# Patient Record
Sex: Male | Born: 1950 | Race: White | Hispanic: No | Marital: Married | Smoking: Former smoker
Health system: Southern US, Community
[De-identification: ages and names within clinical notes are randomized; demographics above are authoritative.]

## PROBLEM LIST (undated history)

## (undated) DIAGNOSIS — K219 Gastro-esophageal reflux disease without esophagitis: Secondary | ICD-10-CM

## (undated) DIAGNOSIS — B1920 Unspecified viral hepatitis C without hepatic coma: Secondary | ICD-10-CM

## (undated) DIAGNOSIS — Z8669 Personal history of other diseases of the nervous system and sense organs: Secondary | ICD-10-CM

## (undated) DIAGNOSIS — Z87442 Personal history of urinary calculi: Secondary | ICD-10-CM

## (undated) DIAGNOSIS — K5900 Constipation, unspecified: Secondary | ICD-10-CM

## (undated) DIAGNOSIS — M1990 Unspecified osteoarthritis, unspecified site: Secondary | ICD-10-CM

## (undated) DIAGNOSIS — F4310 Post-traumatic stress disorder, unspecified: Secondary | ICD-10-CM

## (undated) DIAGNOSIS — Z8614 Personal history of Methicillin resistant Staphylococcus aureus infection: Secondary | ICD-10-CM

## (undated) DIAGNOSIS — J45909 Unspecified asthma, uncomplicated: Secondary | ICD-10-CM

## (undated) DIAGNOSIS — I10 Essential (primary) hypertension: Secondary | ICD-10-CM

## (undated) DIAGNOSIS — G629 Polyneuropathy, unspecified: Secondary | ICD-10-CM

## (undated) DIAGNOSIS — K649 Unspecified hemorrhoids: Secondary | ICD-10-CM

## (undated) HISTORY — PX: EYE SURGERY: SHX253

## (undated) HISTORY — PX: UPPER GASTROINTESTINAL ENDOSCOPY: SHX188

## (undated) HISTORY — PX: TOE AMPUTATION: SHX809

## (undated) HISTORY — PX: TRANSURETHRAL RESECTION OF PROSTATE: SHX73

## (undated) HISTORY — PX: NASAL SINUS SURGERY: SHX719

## (undated) HISTORY — PX: BACK SURGERY: SHX140

## (undated) HISTORY — PX: CATARACT EXTRACTION: SUR2

---

## 1969-06-17 HISTORY — PX: APPENDECTOMY: SHX54

## 2012-03-29 ENCOUNTER — Other Ambulatory Visit: Payer: Self-pay | Admitting: Neurological Surgery

## 2012-05-25 ENCOUNTER — Ambulatory Visit: Admit: 2012-05-25 | Payer: Self-pay | Admitting: Neurological Surgery

## 2012-05-25 SURGERY — LUMBAR LAMINECTOMY/DECOMPRESSION MICRODISCECTOMY
Anesthesia: General | Site: Back | Laterality: Left

## 2012-06-04 ENCOUNTER — Other Ambulatory Visit: Payer: Self-pay | Admitting: Neurological Surgery

## 2012-06-18 ENCOUNTER — Encounter (HOSPITAL_COMMUNITY): Payer: Self-pay

## 2012-06-20 ENCOUNTER — Encounter (HOSPITAL_COMMUNITY)
Admission: RE | Admit: 2012-06-20 | Discharge: 2012-06-20 | Disposition: A | Discharge status: Home / Self Care | Payer: Medicare Other | Source: Ambulatory Visit | Attending: Neurological Surgery | Admitting: Neurological Surgery

## 2012-06-20 ENCOUNTER — Encounter (HOSPITAL_COMMUNITY)
Admission: RE | Admit: 2012-06-20 | Discharge: 2012-06-20 | Disposition: A | Discharge status: Home / Self Care | Payer: Medicare Other | Source: Ambulatory Visit | Attending: Anesthesiology | Admitting: Anesthesiology

## 2012-06-20 ENCOUNTER — Encounter (HOSPITAL_COMMUNITY): Payer: Self-pay

## 2012-06-20 HISTORY — DX: Unspecified viral hepatitis C without hepatic coma: B19.20

## 2012-06-20 HISTORY — DX: Personal history of other diseases of the nervous system and sense organs: Z86.69

## 2012-06-20 HISTORY — DX: Unspecified asthma, uncomplicated: J45.909

## 2012-06-20 HISTORY — DX: Unspecified hemorrhoids: K64.9

## 2012-06-20 HISTORY — DX: Gastro-esophageal reflux disease without esophagitis: K21.9

## 2012-06-20 HISTORY — DX: Polyneuropathy, unspecified: G62.9

## 2012-06-20 HISTORY — DX: Personal history of Methicillin resistant Staphylococcus aureus infection: Z86.14

## 2012-06-20 LAB — CBC
HCT: 38.3 % — ABNORMAL LOW (ref 39.0–52.0)
Hemoglobin: 13.4 g/dL (ref 13.0–17.0)
MCV: 91.6 fL (ref 78.0–100.0)
RBC: 4.18 MIL/uL — ABNORMAL LOW (ref 4.22–5.81)
RDW: 12.5 % (ref 11.5–15.5)
WBC: 7.9 10*3/uL (ref 4.0–10.5)

## 2012-06-20 LAB — COMPREHENSIVE METABOLIC PANEL
Albumin: 3.8 g/dL (ref 3.5–5.2)
BUN: 14 mg/dL (ref 6–23)
Calcium: 9.6 mg/dL (ref 8.4–10.5)
GFR calc Af Amer: >90 mL/min (ref >90)
Glucose, Bld: 84 mg/dL (ref 70–99)
Potassium: 4 mEq/L (ref 3.5–5.1)
Total Protein: 7.1 g/dL (ref 6.0–8.3)

## 2012-06-20 LAB — SURGICAL PCR SCREEN: MRSA, PCR: POSITIVE — AB

## 2012-06-20 NOTE — Pre-Procedure Instructions (Signed)
20 Tanner Santana  06/20/2012   Your procedure is scheduled on:  December 10  Report to [facility] at 05:30 AM.  Call this number if you have problems the morning of surgery: [phone]   Remember:   Do not eat or drink:After Midnight.  Take these medicines the morning of surgery with A SIP OF WATER: Inhaler, Morphine, Omeprazole, Oxycodone. Prednisone   Do not wear jewelry, make-up or nail polish.  Do not wear lotions, powders, or perfumes. You may wear deodorant.  Do not shave 48 hours prior to surgery. Men may shave face and neck.  Do not bring valuables to the hospital.  Contacts, dentures or bridgework may not be worn into surgery.  Leave suitcase in the car. After surgery it may be brought to your room.  For patients admitted to the hospital, checkout time is 11:00 AM the day of discharge.   Patients discharged the day of surgery will not be allowed to drive home.  Name and phone number of your driver: Maddix Heinz  Special Instructions: Shower using CHG 2 nights before surgery and the night before surgery.  If you shower the day of surgery use CHG.  Use special wash - you have one bottle of CHG for all showers.  You should use approximately 1/3 of the bottle for each shower.   Please read over the following fact sheets that you were given: Pain Booklet, Coughing and Deep Breathing and Surgical Site Infection Prevention

## 2012-06-25 MED ORDER — CEFAZOLIN SODIUM-DEXTROSE 2-3 GM-% IV SOLR
2.0000 g | INTRAVENOUS | Status: AC
  Administered 2012-06-26: 2 g via INTRAVENOUS
  Filled 2012-06-25: qty 50

## 2012-06-26 ENCOUNTER — Ambulatory Visit (HOSPITAL_COMMUNITY)
Admission: RE | Admit: 2012-06-26 | Discharge: 2012-06-26 | Disposition: A | Discharge status: Home / Self Care | Payer: Medicare Other | Source: Ambulatory Visit | Attending: Neurological Surgery | Admitting: Neurological Surgery

## 2012-06-26 ENCOUNTER — Ambulatory Visit (HOSPITAL_COMMUNITY): Payer: Medicare Other

## 2012-06-26 ENCOUNTER — Encounter (HOSPITAL_COMMUNITY)
Admission: RE | Disposition: A | Discharge status: Home / Self Care | Payer: Self-pay | Source: Ambulatory Visit | Attending: Neurological Surgery

## 2012-06-26 ENCOUNTER — Encounter (HOSPITAL_COMMUNITY): Payer: Self-pay | Admitting: Anesthesiology

## 2012-06-26 ENCOUNTER — Ambulatory Visit (HOSPITAL_COMMUNITY): Payer: Medicare Other | Admitting: Anesthesiology

## 2012-06-26 DIAGNOSIS — Z01810 Encounter for preprocedural cardiovascular examination: Secondary | ICD-10-CM | POA: Insufficient documentation

## 2012-06-26 DIAGNOSIS — Z77098 Contact with and (suspected) exposure to other hazardous, chiefly nonmedicinal, chemicals: Secondary | ICD-10-CM | POA: Insufficient documentation

## 2012-06-26 DIAGNOSIS — Z01812 Encounter for preprocedural laboratory examination: Secondary | ICD-10-CM | POA: Insufficient documentation

## 2012-06-26 DIAGNOSIS — K219 Gastro-esophageal reflux disease without esophagitis: Secondary | ICD-10-CM | POA: Insufficient documentation

## 2012-06-26 DIAGNOSIS — M47817 Spondylosis without myelopathy or radiculopathy, lumbosacral region: Secondary | ICD-10-CM | POA: Insufficient documentation

## 2012-06-26 DIAGNOSIS — J45909 Unspecified asthma, uncomplicated: Secondary | ICD-10-CM | POA: Insufficient documentation

## 2012-06-26 DIAGNOSIS — M47816 Spondylosis without myelopathy or radiculopathy, lumbar region: Secondary | ICD-10-CM

## 2012-06-26 DIAGNOSIS — Z01818 Encounter for other preprocedural examination: Secondary | ICD-10-CM | POA: Insufficient documentation

## 2012-06-26 HISTORY — PX: LUMBAR LAMINECTOMY/DECOMPRESSION MICRODISCECTOMY: SHX5026

## 2012-06-26 SURGERY — LUMBAR LAMINECTOMY/DECOMPRESSION MICRODISCECTOMY 1 LEVEL
Anesthesia: General | Site: Spine Lumbar | Laterality: Left | Wound class: Clean

## 2012-06-26 MED ORDER — MENTHOL 3 MG MT LOZG: 1.0000 | LOZENGE | OROMUCOSAL | Status: DC | PRN

## 2012-06-26 MED ORDER — PREDNISONE 5 MG PO TABS
5.0000 mg | ORAL_TABLET | Freq: Every day | ORAL | Status: DC
  Administered 2012-06-26: 5 mg via ORAL
  Filled 2012-06-26: qty 1

## 2012-06-26 MED ORDER — SODIUM CHLORIDE 0.9 % IV SOLN
INTRAVENOUS | Status: AC
  Filled 2012-06-26: qty 500

## 2012-06-26 MED ORDER — SODIUM CHLORIDE 0.9 % IJ SOLN
3.0000 mL | Freq: Two times a day (BID) | INTRAMUSCULAR | Status: DC
  Administered 2012-06-26: 3 mL via INTRAVENOUS

## 2012-06-26 MED ORDER — MIDAZOLAM HCL 5 MG/5ML IJ SOLN
INTRAMUSCULAR | Status: DC | PRN
  Administered 2012-06-26: 2 mg via INTRAVENOUS

## 2012-06-26 MED ORDER — DIAZEPAM 5 MG PO TABS: 5.0000 mg | ORAL_TABLET | Freq: Four times a day (QID) | ORAL | Status: DC | PRN

## 2012-06-26 MED ORDER — PHENOL 1.4 % MT LIQD: 1.0000 | OROMUCOSAL | Status: DC | PRN

## 2012-06-26 MED ORDER — ROCURONIUM BROMIDE 100 MG/10ML IV SOLN
INTRAVENOUS | Status: DC | PRN
  Administered 2012-06-26: 50 mg via INTRAVENOUS

## 2012-06-26 MED ORDER — PROMETHAZINE HCL 25 MG/ML IJ SOLN: 6.2500 mg | INTRAMUSCULAR | Status: DC | PRN

## 2012-06-26 MED ORDER — ONDANSETRON HCL 4 MG/2ML IJ SOLN
INTRAMUSCULAR | Status: DC | PRN
  Administered 2012-06-26: 4 mg via INTRAVENOUS

## 2012-06-26 MED ORDER — LIDOCAINE HCL (CARDIAC) 20 MG/ML IV SOLN
INTRAVENOUS | Status: DC | PRN
  Administered 2012-06-26: 100 mg via INTRAVENOUS

## 2012-06-26 MED ORDER — BACITRACIN 50000 UNITS IM SOLR
INTRAMUSCULAR | Status: AC
  Filled 2012-06-26: qty 1

## 2012-06-26 MED ORDER — PANTOPRAZOLE SODIUM 40 MG PO TBEC: 40.0000 mg | DELAYED_RELEASE_TABLET | Freq: Every day | ORAL | Status: DC

## 2012-06-26 MED ORDER — MORPHINE SULFATE ER 60 MG PO TBCR
60.0000 mg | EXTENDED_RELEASE_TABLET | Freq: Three times a day (TID) | ORAL | Status: DC
  Administered 2012-06-26: 60 mg via ORAL
  Filled 2012-06-26: qty 1

## 2012-06-26 MED ORDER — OXYCODONE HCL 5 MG PO TABS
5.0000 mg | ORAL_TABLET | Freq: Once | ORAL | Status: AC | PRN
  Administered 2012-06-26: 5 mg via ORAL

## 2012-06-26 MED ORDER — THROMBIN 5000 UNITS EX KIT
PACK | CUTANEOUS | Status: DC | PRN
  Administered 2012-06-26 (×2): 5000 [IU] via TOPICAL

## 2012-06-26 MED ORDER — OXYCODONE HCL 5 MG PO TABS
ORAL_TABLET | ORAL | Status: AC
  Filled 2012-06-26: qty 1

## 2012-06-26 MED ORDER — HEMOSTATIC AGENTS (NO CHARGE) OPTIME
TOPICAL | Status: DC | PRN
  Administered 2012-06-26: 1 via TOPICAL

## 2012-06-26 MED ORDER — OXYCODONE HCL 5 MG PO TABS: 5.0000 mg | ORAL_TABLET | Freq: Two times a day (BID) | ORAL | Status: DC | PRN

## 2012-06-26 MED ORDER — 0.9 % SODIUM CHLORIDE (POUR BTL) OPTIME
TOPICAL | Status: DC | PRN
  Administered 2012-06-26: 1000 mL

## 2012-06-26 MED ORDER — HYDROMORPHONE HCL PF 1 MG/ML IJ SOLN
INTRAMUSCULAR | Status: AC
  Filled 2012-06-26: qty 1

## 2012-06-26 MED ORDER — CIPROFLOXACIN HCL 500 MG PO TABS
500.0000 mg | ORAL_TABLET | Freq: Two times a day (BID) | ORAL | Status: DC
  Administered 2012-06-26: 500 mg via ORAL
  Filled 2012-06-26 (×2): qty 1

## 2012-06-26 MED ORDER — OXYCODONE-ACETAMINOPHEN 5-325 MG PO TABS: 1.0000 | ORAL_TABLET | Freq: Two times a day (BID) | ORAL | Status: DC | PRN

## 2012-06-26 MED ORDER — CEFAZOLIN SODIUM 1-5 GM-% IV SOLN
1.0000 g | Freq: Three times a day (TID) | INTRAVENOUS | Status: DC
  Administered 2012-06-26: 1 g via INTRAVENOUS
  Filled 2012-06-26 (×2): qty 50

## 2012-06-26 MED ORDER — VECURONIUM BROMIDE 10 MG IV SOLR
INTRAVENOUS | Status: DC | PRN
  Administered 2012-06-26: 2 mg via INTRAVENOUS

## 2012-06-26 MED ORDER — MORPHINE SULFATE 2 MG/ML IJ SOLN
1.0000 mg | INTRAMUSCULAR | Status: DC | PRN
  Administered 2012-06-26: 2 mg via INTRAVENOUS
  Filled 2012-06-26: qty 1

## 2012-06-26 MED ORDER — OXYCODONE-ACETAMINOPHEN 10-325 MG PO TABS: 1.0000 | ORAL_TABLET | Freq: Two times a day (BID) | ORAL | Status: DC | PRN

## 2012-06-26 MED ORDER — ACETAMINOPHEN 650 MG RE SUPP: 650.0000 mg | RECTAL | Status: DC | PRN

## 2012-06-26 MED ORDER — LACTATED RINGERS IV SOLN
INTRAVENOUS | Status: DC | PRN
  Administered 2012-06-26: 07:00:00 via INTRAVENOUS

## 2012-06-26 MED ORDER — SODIUM CHLORIDE 0.9 % IR SOLN
Status: DC | PRN
  Administered 2012-06-26: 09:00:00

## 2012-06-26 MED ORDER — LIDOCAINE-EPINEPHRINE 1 %-1:100000 IJ SOLN
INTRAMUSCULAR | Status: DC | PRN
  Administered 2012-06-26: 4.5 mL

## 2012-06-26 MED ORDER — NEOSTIGMINE METHYLSULFATE 1 MG/ML IJ SOLN
INTRAMUSCULAR | Status: DC | PRN
  Administered 2012-06-26: 3 mg via INTRAVENOUS
  Administered 2012-06-26 (×2): 1 mg via INTRAVENOUS

## 2012-06-26 MED ORDER — OXYCODONE HCL 5 MG/5ML PO SOLN: 5.0000 mg | Freq: Once | ORAL | Status: AC | PRN

## 2012-06-26 MED ORDER — ACETAMINOPHEN 325 MG PO TABS: 650.0000 mg | ORAL_TABLET | ORAL | Status: DC | PRN

## 2012-06-26 MED ORDER — DEXAMETHASONE SODIUM PHOSPHATE 4 MG/ML IJ SOLN
INTRAMUSCULAR | Status: DC | PRN
  Administered 2012-06-26: 8 mg via INTRAVENOUS

## 2012-06-26 MED ORDER — GLYCOPYRROLATE 0.2 MG/ML IJ SOLN
INTRAMUSCULAR | Status: DC | PRN
  Administered 2012-06-26: 0.2 mg via INTRAVENOUS
  Administered 2012-06-26: 0.4 mg via INTRAVENOUS
  Administered 2012-06-26: 0.2 mg via INTRAVENOUS

## 2012-06-26 MED ORDER — NORTRIPTYLINE HCL 25 MG PO CAPS
75.0000 mg | ORAL_CAPSULE | Freq: Every day | ORAL | Status: DC
  Filled 2012-06-26: qty 3

## 2012-06-26 MED ORDER — HYDROMORPHONE HCL PF 1 MG/ML IJ SOLN
0.2500 mg | INTRAMUSCULAR | Status: DC | PRN
  Administered 2012-06-26 (×2): 0.5 mg via INTRAVENOUS

## 2012-06-26 MED ORDER — FENTANYL CITRATE 0.05 MG/ML IJ SOLN
INTRAMUSCULAR | Status: DC | PRN
  Administered 2012-06-26: 50 ug via INTRAVENOUS
  Administered 2012-06-26: 150 ug via INTRAVENOUS
  Administered 2012-06-26: 50 ug via INTRAVENOUS

## 2012-06-26 MED ORDER — SODIUM CHLORIDE 0.9 % IV SOLN: 250.0000 mL | INTRAVENOUS | Status: DC

## 2012-06-26 MED ORDER — PROPOFOL 10 MG/ML IV BOLUS
INTRAVENOUS | Status: DC | PRN
  Administered 2012-06-26: 200 mg via INTRAVENOUS

## 2012-06-26 MED ORDER — ONDANSETRON HCL 4 MG/2ML IJ SOLN: 4.0000 mg | INTRAMUSCULAR | Status: DC | PRN

## 2012-06-26 MED ORDER — SODIUM CHLORIDE 0.9 % IJ SOLN: 3.0000 mL | INTRAMUSCULAR | Status: DC | PRN

## 2012-06-26 MED ORDER — KETOROLAC TROMETHAMINE 15 MG/ML IJ SOLN
15.0000 mg | Freq: Four times a day (QID) | INTRAMUSCULAR | Status: DC
  Administered 2012-06-26: 15 mg via INTRAVENOUS
  Filled 2012-06-26: qty 1

## 2012-06-26 MED ORDER — MORPHINE SULFATE 10 MG/ML IJ SOLN
INTRAMUSCULAR | Status: DC | PRN
  Administered 2012-06-26 (×2): 5 mg via INTRAVENOUS

## 2012-06-26 MED ORDER — FLUTICASONE PROPIONATE HFA 44 MCG/ACT IN AERO
2.0000 | INHALATION_SPRAY | Freq: Two times a day (BID) | RESPIRATORY_TRACT | Status: DC
  Filled 2012-06-26: qty 10.6

## 2012-06-26 MED ORDER — BUPIVACAINE HCL (PF) 0.5 % IJ SOLN
INTRAMUSCULAR | Status: DC | PRN
  Administered 2012-06-26: 4.5 mL
  Administered 2012-06-26: 10 mL

## 2012-06-26 SURGICAL SUPPLY — 53 items
BAG DECANTER FOR FLEXI CONT (MISCELLANEOUS) ×2 IMPLANT
BLADE SURG ROTATE 9660 (MISCELLANEOUS) IMPLANT
BUR ACORN 6.0 (BURR) ×2 IMPLANT
BUR MATCHSTICK NEURO 3.0 LAGG (BURR) ×2 IMPLANT
CANISTER SUCTION 2500CC (MISCELLANEOUS) ×2 IMPLANT
CLOTH BEACON ORANGE TIMEOUT ST (SAFETY) ×2 IMPLANT
CONT SPEC 4OZ CLIKSEAL STRL BL (MISCELLANEOUS) ×2 IMPLANT
DECANTER SPIKE VIAL GLASS SM (MISCELLANEOUS) ×2 IMPLANT
DERMABOND ADHESIVE PROPEN (GAUZE/BANDAGES/DRESSINGS) ×1
DERMABOND ADVANCED (GAUZE/BANDAGES/DRESSINGS)
DERMABOND ADVANCED .7 DNX12 (GAUZE/BANDAGES/DRESSINGS) IMPLANT
DERMABOND ADVANCED .7 DNX6 (GAUZE/BANDAGES/DRESSINGS) ×1 IMPLANT
DRAPE MICROSCOPE LEICA (MISCELLANEOUS) ×4 IMPLANT
DRAPE PED LAPAROTOMY (DRAPES) ×2 IMPLANT
DRAPE POUCH INSTRU U-SHP 10X18 (DRAPES) ×2 IMPLANT
DRAPE PROXIMA HALF (DRAPES) IMPLANT
DURAPREP 26ML APPLICATOR (WOUND CARE) ×2 IMPLANT
ELECT REM PT RETURN 9FT ADLT (ELECTROSURGICAL) ×2
ELECTRODE REM PT RTRN 9FT ADLT (ELECTROSURGICAL) ×1 IMPLANT
GAUZE SPONGE 4X4 16PLY XRAY LF (GAUZE/BANDAGES/DRESSINGS) IMPLANT
GLOVE BIO SURGEON STRL SZ 6.5 (GLOVE) ×2 IMPLANT
GLOVE BIOGEL PI IND STRL 6.5 (GLOVE) ×2 IMPLANT
GLOVE BIOGEL PI IND STRL 8.5 (GLOVE) ×2 IMPLANT
GLOVE BIOGEL PI INDICATOR 6.5 (GLOVE) ×2
GLOVE BIOGEL PI INDICATOR 8.5 (GLOVE) ×2
GLOVE ECLIPSE 6.5 STRL STRAW (GLOVE) ×6 IMPLANT
GLOVE ECLIPSE 8.5 STRL (GLOVE) ×2 IMPLANT
GLOVE EXAM NITRILE LRG STRL (GLOVE) IMPLANT
GLOVE EXAM NITRILE MD LF STRL (GLOVE) IMPLANT
GLOVE EXAM NITRILE XL STR (GLOVE) IMPLANT
GLOVE EXAM NITRILE XS STR PU (GLOVE) IMPLANT
GOWN BRE IMP SLV AUR LG STRL (GOWN DISPOSABLE) ×4 IMPLANT
GOWN BRE IMP SLV AUR XL STRL (GOWN DISPOSABLE) ×2 IMPLANT
GOWN STRL REIN 2XL LVL4 (GOWN DISPOSABLE) ×2 IMPLANT
KIT BASIN OR (CUSTOM PROCEDURE TRAY) ×2 IMPLANT
KIT ROOM TURNOVER OR (KITS) ×2 IMPLANT
NEEDLE HYPO 22GX1.5 SAFETY (NEEDLE) ×2 IMPLANT
NEEDLE SPNL 20GX3.5 QUINCKE YW (NEEDLE) ×2 IMPLANT
NS IRRIG 1000ML POUR BTL (IV SOLUTION) ×2 IMPLANT
PACK LAMINECTOMY NEURO (CUSTOM PROCEDURE TRAY) ×2 IMPLANT
PAD ARMBOARD 7.5X6 YLW CONV (MISCELLANEOUS) ×10 IMPLANT
PATTIES SURGICAL .5 X1 (DISPOSABLE) ×2 IMPLANT
RUBBERBAND STERILE (MISCELLANEOUS) ×4 IMPLANT
SPONGE GAUZE 4X4 12PLY (GAUZE/BANDAGES/DRESSINGS) IMPLANT
SPONGE SURGIFOAM ABS GEL SZ50 (HEMOSTASIS) ×2 IMPLANT
SUT VIC AB 1 CT1 18XBRD ANBCTR (SUTURE) ×1 IMPLANT
SUT VIC AB 1 CT1 8-18 (SUTURE) ×1
SUT VIC AB 2-0 CP2 18 (SUTURE) ×2 IMPLANT
SUT VIC AB 3-0 SH 8-18 (SUTURE) ×2 IMPLANT
SYR 20ML ECCENTRIC (SYRINGE) ×2 IMPLANT
TOWEL OR 17X24 6PK STRL BLUE (TOWEL DISPOSABLE) ×2 IMPLANT
TOWEL OR 17X26 10 PK STRL BLUE (TOWEL DISPOSABLE) ×2 IMPLANT
WATER STERILE IRR 1000ML POUR (IV SOLUTION) ×2 IMPLANT

## 2012-06-26 NOTE — Preoperative (Signed)
Beta Blockers   Reason not to administer Beta Blockers:Not Applicable 

## 2012-06-26 NOTE — Anesthesia Preprocedure Evaluation (Addendum)
Anesthesia Evaluation  Patient identified by MRN, date of birth, ID band Patient awake    Reviewed: Allergy & Precautions, H&P , NPO status , Patient's Chart, lab work & pertinent test results  History of Anesthesia Complications Negative for: history of anesthetic complications  Airway Mallampati: II TM Distance: >3 FB Neck ROM: Full    Dental   Pulmonary asthma ,    Pulmonary exam normal       Cardiovascular negative cardio ROS      Neuro/Psych    GI/Hepatic GERD-  Medicated,(+) Hepatitis -, C  Endo/Other  negative endocrine ROS  Renal/GU negative Renal ROS     Musculoskeletal   Abdominal   Peds  Hematology negative hematology ROS (+)   Anesthesia Other Findings   Reproductive/Obstetrics                          Anesthesia Physical Anesthesia Plan  ASA: II  Anesthesia Plan: General   Post-op Pain Management:    Induction: Intravenous  Airway Management Planned: Oral ETT  Additional Equipment:   Intra-op Plan:   Post-operative Plan: Extubation in OR  Informed Consent: I have reviewed the patients History and Physical, chart, labs and discussed the procedure including the risks, benefits and alternatives for the proposed anesthesia with the patient or authorized representative who has indicated his/her understanding and acceptance.   Dental advisory given  Plan Discussed with: CRNA, Anesthesiologist and Surgeon  Anesthesia Plan Comments:        Anesthesia Quick Evaluation

## 2012-06-26 NOTE — Anesthesia Postprocedure Evaluation (Signed)
Anesthesia Post Note  Patient: Tanner Santana  Procedure(s) Performed: Procedure(s) (LRB): LUMBAR LAMINECTOMY/DECOMPRESSION MICRODISCECTOMY 1 LEVEL (Left)  Anesthesia type: general  Patient location: PACU  Post pain: Pain level controlled  Post assessment: Patient's Cardiovascular Status Stable  Last Vitals:  Filed Vitals:   06/26/12 0941  BP: 160/88  Pulse: 89  Temp:   Resp: 18    Post vital signs: Reviewed and stable  Level of consciousness: sedated  Complications: No apparent anesthesia complications

## 2012-06-26 NOTE — Plan of Care (Signed)
Problem: Consults Goal: Diagnosis - Spinal Surgery Outcome: Completed/Met Date Met:  06/26/12 Lumbar Laminectomy (Complex)

## 2012-06-26 NOTE — Discharge Summary (Signed)
Physician Discharge Summary  Patient ID: Tanner Santana MRN: [id number] DOB/AGE: [birth year] 61 y.o.  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 *    Discharged Condition: good  Hospital Course: Patient was admitted to undergo laminotomy and foraminotomies L4-L5 on the left he tolerated this procedure well.  Consults: None  Significant Diagnostic Studies: MRI lumbar spine  Treatments: Laminotomy foraminotomy with operating microscope microdissection technique L4-L5 left  Discharge Exam: Blood pressure 126/76, pulse 83, temperature 98.7 F (37.1 C), temperature source Oral, resp. rate 18, SpO2 95.00%. Incision/Wound: clean and dry. Motor function normal in lower extremities.  Disposition: Home  Discharge Orders    Future Orders Please Complete By Expires   Diet - low sodium heart healthy      Increase activity slowly      Discharge instructions      Comments:   Okay to shower. Do not apply salves or appointments to incision. No heavy lifting with the upper extremities greater than 15 pounds. May resume driving when not requiring pain medication and patient feels comfortable with doing so.   Call MD for:  redness, tenderness, or signs of infection (pain, swelling, redness, odor or green/yellow discharge around incision site)      Call MD for:  severe uncontrolled pain      Call MD for:  temperature >100.4          Medication List     As of 06/26/2012  5:04 PM    TAKE these medications         aspirin EC 81 MG tablet   Take 81 mg by mouth daily.      ciprofloxacin 500 MG tablet   Commonly known as: CIPRO   Take 500 mg by mouth 2 (two) times daily.      diazepam 5 MG tablet   Commonly known as: VALIUM   Take 1 tablet (5 mg total) by  mouth every 6 (six) hours as needed (Muscle spasm).      mometasone 220 MCG/INH inhaler   Commonly known as: ASMANEX   Inhale 1 puff into the lungs daily.      morphine 60 MG 12 hr tablet   Commonly known as: MS CONTIN   Take 60 mg by mouth 3 (three) times daily.      nortriptyline 25 MG capsule   Commonly known as: PAMELOR   Take 75 mg by mouth at bedtime.      omeprazole 20 MG capsule   Commonly known as: PRILOSEC   Take 20 mg by mouth 2 (two) times daily.      oxyCODONE-acetaminophen 10-325 MG per tablet   Commonly known as: PERCOCET   Take 1 tablet by mouth 2 (two) times daily as needed. For pain      predniSONE 5 MG tablet   Commonly known as: DELTASONE   Take 5 mg by mouth daily.      Vitamin D2 2000 UNITS Tabs   Take 1 tablet by mouth daily.         SignedStefani Dama 06/26/2012, 5:04 PM

## 2012-06-26 NOTE — H&P (Signed)
CHIEF COMPLAINT:   Low back pain going down left lower extremity on and off for many years.    HISTORY OF PRESENT ILLNESS:  Tanner Santana is a 61 year old, right-handed individual who tells me he has had low back pain for a number of years.  He has had pain down the left lower extremity that has been going on and off, but over the past number of months, he has had several episodes where the left leg has gave way on him suddenly.  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  This process is healing at the current time.  He tells me that he has had a number of epidural steroid injections in the process of treating this pain and recently he has had three epidural injections.  He notes that he had no relief with any of those injections.    IMAGING STUDIES:   He brings with him an MRI of the lumbar spine that was performed at [facility].  The study is from February of this year.  It demonstrates that the patient has good alignment in the coronal and sagittal planes.  He does, however, have some modest disc bulging at L4-5 and L5-S1.  He has an amply patent spinal canal, but the most significant process occurs at L4-L5 where there is lateral recess stenosis for the exiting L5 nerve root.  There is worse stenosis on the left side in the lateral recess than on the right side.  I demonstrated the findings to Mr. Busic today.  I note that the other levels have no such finding and the path of the L5 nerve root is clearly under lateral recess with the compression caused mostly by hypertrophy of the facet joint on the left side more so than on the right side.  I note that there is some slight amount of lateral recess stenosis on the right side.    PHYSICAL EXAMINATION:  At this time, the patient notes that his leg  strength has been good.  He does have the left foot in a wooden shoe.  He does demonstrate that he has good dorsiflexor function.    IMPRESSION:    I discussed with the patient consideration of surgical intervention for his L5 radiculopathy.  I note that he has lateral recess stenosis secondary to hypertrophy of the facet joint and I suggested that the simplest procedure to consider would be a laminotomy and foraminotomy at L4-L5 on the left side.  I would leave L5 on the right sidealone.  I believe that simply decompressing the lateral recess should help alleviate any irritability for that nerve root and hopefully lessen the episodes of give way and weakness that he may experience in that left side.  I noted to him that the surgery does not correct all of the problems in his lumbar spine.  He does have an underlying degenerated disc that may be contributing some to the discomfort, but I believe that the nerve root entrapment likely aggravates his neuropathic, as well as radiculopathic symptoms.  We can schedule this at the earliest convenience, though I did suggest that we should allow the toe a longer period of time to heal.  The surgery typically can be done on an outpatient basis that is the patients come in the  morning, they have the surgery and then after recovery, if they are standing, voiding, not nauseated and the pain is well controlled, they can be discharged home that evening. He for surgery now.

## 2012-06-26 NOTE — Transfer of Care (Signed)
Immediate Anesthesia Transfer of Care Note  Patient: Tanner Santana  Procedure(s) Performed: Procedure(s) (LRB) with comments: LUMBAR LAMINECTOMY/DECOMPRESSION MICRODISCECTOMY 1 LEVEL (Left) - Left Lumbar four-five Laminectomy/foraminotomy/microscope  Patient Location: PACU  Anesthesia Type:General  Level of Consciousness: awake, oriented and patient cooperative  Airway & Oxygen Therapy: Patient Spontanous Breathing and Patient connected to nasal cannula oxygen  Post-op Assessment: Report given to PACU RN and Post -op Vital signs reviewed and stable  Post vital signs: Reviewed and stable  Complications: No apparent anesthesia complications

## 2012-06-26 NOTE — Op Note (Signed)
Preoperative diagnosis: L4-5 spondylosis with left lumbar radiculopathy, left lateral recess stenosis Postoperative diagnosis: L4-5 spondylosis with left lumbar radiculopathy, left lateral recess stenosis Procedure: Laminotomy and foraminotomies L4-5 left with operating microscope micro-dissection technique Surgeon: Barnett Abu M.D. Assistant: Lelon Perla Anesthesia: Gen. endotracheal Indications: Patient is a 61 year old individual is had significant problems with back and left lower extremity pain failed all manner of conservative therapy including a number of cortisone injections. Been advised regarding a laminotomy and foraminotomies for decompression of what appears to be lateral recess stenosis possibly secondary to an early synovial cyst at L4-L5 on left.  Procedure: Patient was brought to the operating room supine on a stretcher. After the smooth induction of general endotracheal anesthesia he was turned prone onto the operating table. The back was prepped with alcohol and DuraPrep and draped in a sterile fashion. Localizing radiographs identified the interspace at L4-5. A midline incision was created and carried down to the lumbar dorsal fascia which was opened on either side of midline at this level. The dissection was carried out over the interlaminar space and the facet joints at L4-5. A self-retaining retractor was placed in the wound. A high-speed drill was then used to remove the inferior margin of the lamina out to the medial wall the facet performing the initial portion of the dissection. The yellow ligament was then taken up and removed. Common dural tube was identified and dissection was carefully undertaken removing redundant yellow ligament and overgrown facet from the superior facet of L5and the laminar arch of L4. A foraminotomy was created over the L5 nerve root.  Once an adequate decompression was identified and secured, hemostasis and the soft tissues obtained meticulously and  when verified retractor was removed the wound was irrigated copiously with antibiotic irrigating solution, and then the lumbar dorsal fascia was closed with #1 Vicryl in interrupted fashion. 10 cc Of half percent Marcaine was injected into the paraspinous fascia. 2-0 Vicryl was used to close the subcutaneous fascia and 3-0 Vicryl was used to close the subcuticular skin. Blood loss was estimated as 20 cc. The patient was returned to the recovery room in stable condition

## 2012-06-27 ENCOUNTER — Encounter (HOSPITAL_COMMUNITY): Payer: Self-pay | Admitting: Neurological Surgery

## 2014-11-05 IMAGING — CR DG LUMBAR SPINE 2-3V
1 series · 1 of 1 positions shown · non-contrast
Comparison: None.

CLINICAL DATA: L4-5 laminectomy.

LUMBAR SPINE - 2-3 VIEW

[view not recorded]
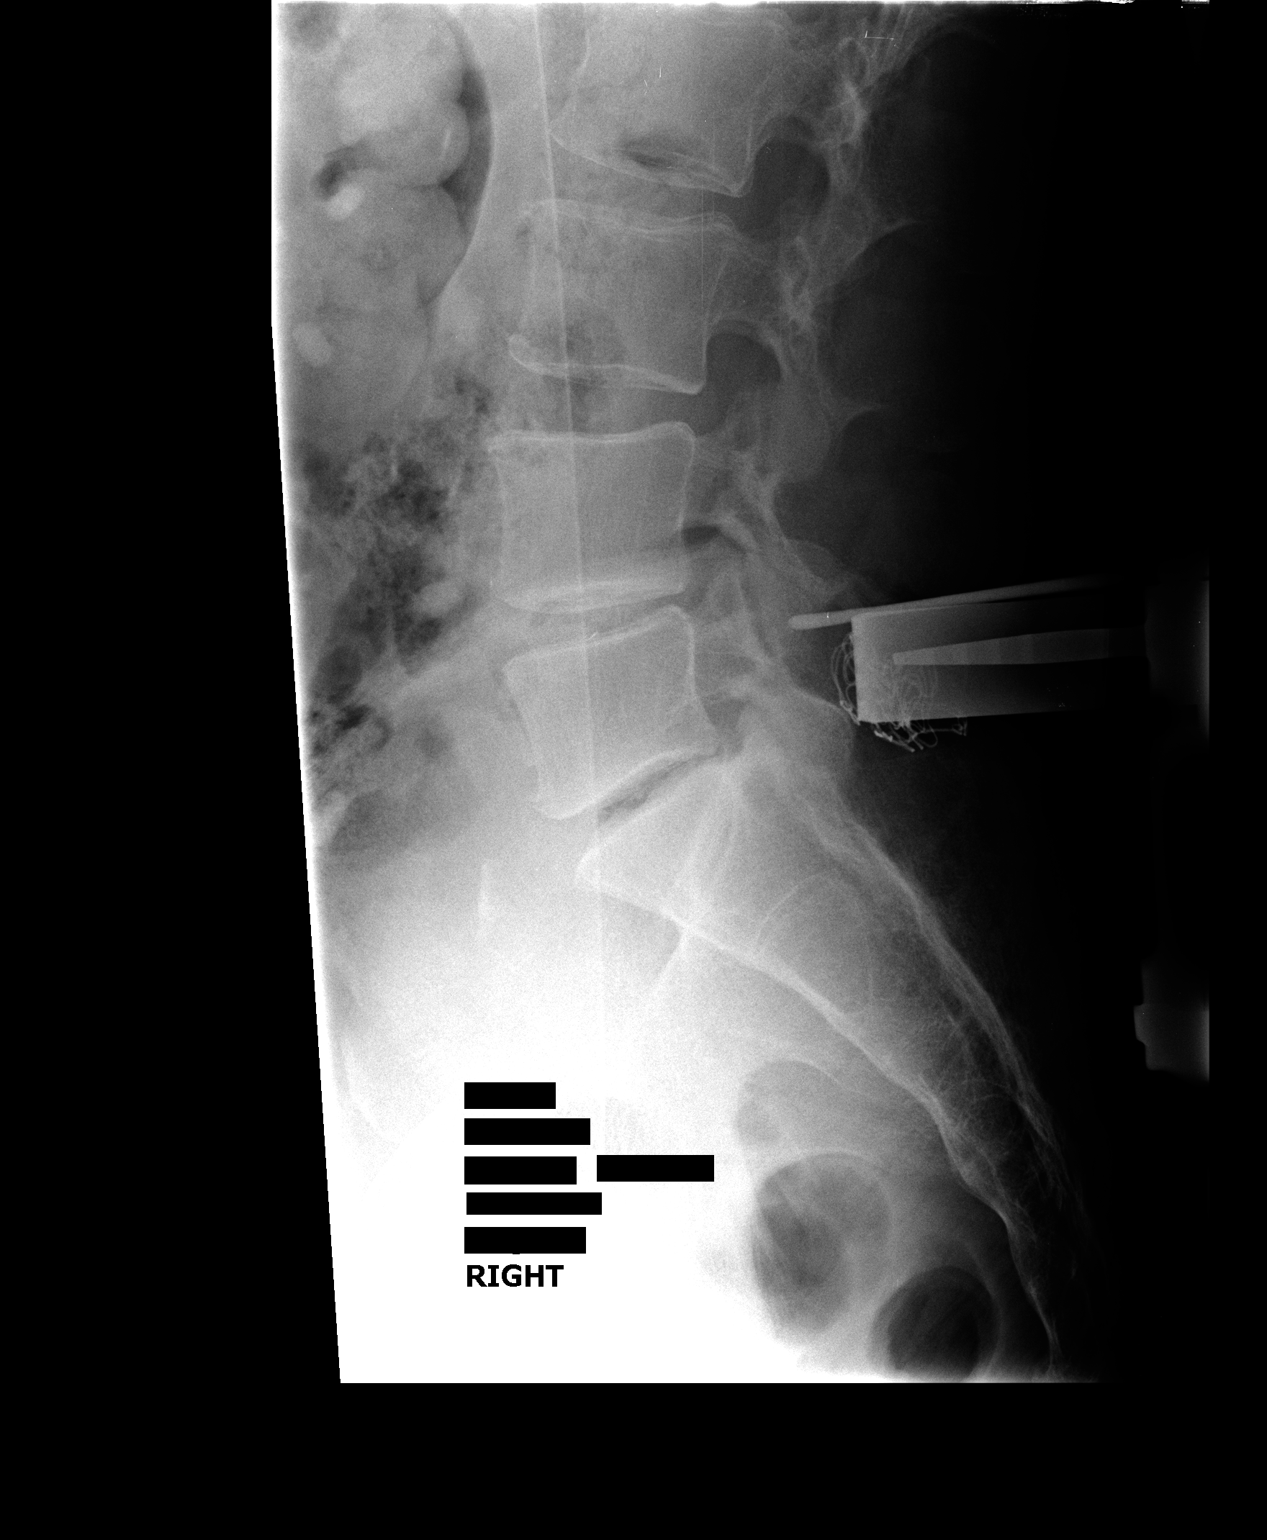

[1 of 1 positions shown; findings below may reference images not displayed]

FINDINGS: Two intraoperative lateral views of the lumbar spine
submitted for review after surgery.  For the present examination,
the last fully open disc space is labeled the L5-S1 level.

First film reveals metallic probe directed towards the L5 pedicle
level.

Second film submitted for review reveals superior metallic probe
lower aspect of the L4 facet directed to the superior aspect of the
L5 vertebra.  Inferior probe is at the L5 pedicle level.  Surgical
sponges in place.  Metallic rakes posterior to the L5 level.
IMPRESSION: Localization just below the L4-5 disc space level and at the L5
pedicle level as noted above.

## 2015-04-18 HISTORY — PX: COLONOSCOPY: SHX174

## 2015-12-17 ENCOUNTER — Encounter (HOSPITAL_COMMUNITY): Payer: Self-pay | Admitting: *Deleted

## 2015-12-17 ENCOUNTER — Other Ambulatory Visit (HOSPITAL_COMMUNITY): Payer: Self-pay | Admitting: Family

## 2015-12-17 MED ORDER — CLINDAMYCIN PHOSPHATE 900 MG/50ML IV SOLN
900.0000 mg | INTRAVENOUS | Status: AC
  Administered 2015-12-18: 900 mg via INTRAVENOUS
  Filled 2015-12-17: qty 50

## 2015-12-17 MED ORDER — MUPIROCIN 2 % EX OINT
1.0000 "application " | TOPICAL_OINTMENT | Freq: Once | CUTANEOUS | Status: AC
  Administered 2015-12-18: 1 via TOPICAL
  Filled 2015-12-17: qty 22

## 2015-12-17 NOTE — Progress Notes (Signed)
Mr Tanner Santana denies chest pain or shortness of breath.  Patient was seen in [facility] [place], because he passed out.  Cardiac enzymes were negative as were chest x-ray and EKG.  Patient reports they it was thought to be due to pain medication.

## 2015-12-18 ENCOUNTER — Ambulatory Visit (HOSPITAL_COMMUNITY): Payer: Medicare Other | Admitting: Anesthesiology

## 2015-12-18 ENCOUNTER — Encounter (HOSPITAL_COMMUNITY): Payer: Self-pay | Admitting: *Deleted

## 2015-12-18 ENCOUNTER — Encounter (HOSPITAL_COMMUNITY)
Admission: RE | Disposition: A | Discharge status: Home / Self Care | Payer: Self-pay | Source: Ambulatory Visit | Attending: Orthopedic Surgery

## 2015-12-18 ENCOUNTER — Ambulatory Visit (HOSPITAL_COMMUNITY)
Admission: RE | Admit: 2015-12-18 | Discharge: 2015-12-18 | Disposition: A | Discharge status: Home / Self Care | Payer: Medicare Other | Source: Ambulatory Visit | Attending: Orthopedic Surgery | Admitting: Orthopedic Surgery

## 2015-12-18 DIAGNOSIS — Z89422 Acquired absence of other left toe(s): Secondary | ICD-10-CM | POA: Insufficient documentation

## 2015-12-18 DIAGNOSIS — M19072 Primary osteoarthritis, left ankle and foot: Secondary | ICD-10-CM | POA: Diagnosis not present

## 2015-12-18 DIAGNOSIS — Z89421 Acquired absence of other right toe(s): Secondary | ICD-10-CM | POA: Diagnosis not present

## 2015-12-18 DIAGNOSIS — Z8614 Personal history of Methicillin resistant Staphylococcus aureus infection: Secondary | ICD-10-CM | POA: Diagnosis not present

## 2015-12-18 DIAGNOSIS — K219 Gastro-esophageal reflux disease without esophagitis: Secondary | ICD-10-CM | POA: Insufficient documentation

## 2015-12-18 DIAGNOSIS — Z87891 Personal history of nicotine dependence: Secondary | ICD-10-CM | POA: Insufficient documentation

## 2015-12-18 DIAGNOSIS — I10 Essential (primary) hypertension: Secondary | ICD-10-CM | POA: Diagnosis not present

## 2015-12-18 DIAGNOSIS — G629 Polyneuropathy, unspecified: Secondary | ICD-10-CM | POA: Diagnosis not present

## 2015-12-18 DIAGNOSIS — E11610 Type 2 diabetes mellitus with diabetic neuropathic arthropathy: Secondary | ICD-10-CM

## 2015-12-18 HISTORY — DX: Essential (primary) hypertension: I10

## 2015-12-18 HISTORY — DX: Unspecified osteoarthritis, unspecified site: M19.90

## 2015-12-18 HISTORY — PX: FOOT ARTHRODESIS: SHX1655

## 2015-12-18 HISTORY — DX: Constipation, unspecified: K59.00

## 2015-12-18 LAB — CBC
HCT: 34.6 % — ABNORMAL LOW (ref 39.0–52.0)
Hemoglobin: 11.4 g/dL — ABNORMAL LOW (ref 13.0–17.0)
MCH: 30.2 pg (ref 26.0–34.0)
MCHC: 32.9 g/dL (ref 30.0–36.0)
MCV: 91.5 fL (ref 78.0–100.0)
PLATELETS: 208 10*3/uL (ref 150–400)
RBC: 3.78 MIL/uL — AB (ref 4.22–5.81)
RDW: 12 % (ref 11.5–15.5)
WBC: 7.7 10*3/uL (ref 4.0–10.5)

## 2015-12-18 LAB — SURGICAL PCR SCREEN
MRSA, PCR: POSITIVE — AB
Staphylococcus aureus: POSITIVE — AB

## 2015-12-18 LAB — BASIC METABOLIC PANEL
Anion gap: 8 (ref 5–15)
BUN: 16 mg/dL (ref 6–20)
CALCIUM: 9.6 mg/dL (ref 8.9–10.3)
CHLORIDE: 101 mmol/L (ref 101–111)
CO2: 27 mmol/L (ref 22–32)
CREATININE: 1.08 mg/dL (ref 0.61–1.24)
GFR calc non Af Amer: >60 mL/min (ref >60)
GLUCOSE: 101 mg/dL — AB (ref 65–99)
Potassium: 4 mmol/L (ref 3.5–5.1)
Sodium: 136 mmol/L (ref 135–145)

## 2015-12-18 SURGERY — FUSION, JOINT, FOOT
Anesthesia: Monitor Anesthesia Care | Laterality: Left

## 2015-12-18 MED ORDER — PROPOFOL 500 MG/50ML IV EMUL
INTRAVENOUS | Status: DC | PRN
  Administered 2015-12-18: 70 ug/kg/min via INTRAVENOUS

## 2015-12-18 MED ORDER — MIDAZOLAM HCL 2 MG/2ML IJ SOLN
INTRAMUSCULAR | Status: AC
  Filled 2015-12-18: qty 2

## 2015-12-18 MED ORDER — BUPIVACAINE-EPINEPHRINE (PF) 0.5% -1:200000 IJ SOLN
INTRAMUSCULAR | Status: DC | PRN
  Administered 2015-12-18: 40 mL via PERINEURAL

## 2015-12-18 MED ORDER — FENTANYL CITRATE (PF) 100 MCG/2ML IJ SOLN
INTRAMUSCULAR | Status: AC
  Administered 2015-12-18: 50 ug
  Filled 2015-12-18: qty 2

## 2015-12-18 MED ORDER — MIDAZOLAM HCL 2 MG/2ML IJ SOLN
INTRAMUSCULAR | Status: AC
  Administered 2015-12-18: 1 mg
  Filled 2015-12-18: qty 2

## 2015-12-18 MED ORDER — DIPHENHYDRAMINE HCL 50 MG/ML IJ SOLN
INTRAMUSCULAR | Status: DC | PRN
  Administered 2015-12-18: 25 mg via INTRAVENOUS

## 2015-12-18 MED ORDER — LACTATED RINGERS IV SOLN
INTRAVENOUS | Status: DC
  Administered 2015-12-18: 14:00:00 via INTRAVENOUS

## 2015-12-18 MED ORDER — CHLORHEXIDINE GLUCONATE 4 % EX LIQD: 60.0000 mL | Freq: Once | CUTANEOUS | Status: DC

## 2015-12-18 MED ORDER — 0.9 % SODIUM CHLORIDE (POUR BTL) OPTIME
TOPICAL | Status: DC | PRN
  Administered 2015-12-18: 1000 mL

## 2015-12-18 MED ORDER — FENTANYL CITRATE (PF) 250 MCG/5ML IJ SOLN
INTRAMUSCULAR | Status: AC
  Filled 2015-12-18: qty 5

## 2015-12-18 MED ORDER — LIDOCAINE 2% (20 MG/ML) 5 ML SYRINGE
INTRAMUSCULAR | Status: AC
  Filled 2015-12-18: qty 5

## 2015-12-18 MED ORDER — PROPOFOL 10 MG/ML IV BOLUS
INTRAVENOUS | Status: AC
  Filled 2015-12-18: qty 20

## 2015-12-18 MED ORDER — FENTANYL CITRATE (PF) 100 MCG/2ML IJ SOLN
INTRAMUSCULAR | Status: DC | PRN
  Administered 2015-12-18: 100 ug via INTRAVENOUS

## 2015-12-18 MED ORDER — FENTANYL CITRATE (PF) 100 MCG/2ML IJ SOLN: 25.0000 ug | INTRAMUSCULAR | Status: DC | PRN

## 2015-12-18 MED ORDER — PROPOFOL 1000 MG/100ML IV EMUL
INTRAVENOUS | Status: AC
  Filled 2015-12-18: qty 100

## 2015-12-18 MED ORDER — LIDOCAINE HCL (CARDIAC) 20 MG/ML IV SOLN
INTRAVENOUS | Status: DC | PRN
  Administered 2015-12-18: 30 mg via INTRAVENOUS

## 2015-12-18 MED ORDER — MIDAZOLAM HCL 5 MG/5ML IJ SOLN
INTRAMUSCULAR | Status: DC | PRN
  Administered 2015-12-18: 2 mg via INTRAVENOUS

## 2015-12-18 MED ORDER — ONDANSETRON HCL 4 MG/2ML IJ SOLN: 4.0000 mg | Freq: Once | INTRAMUSCULAR | Status: DC | PRN

## 2015-12-18 SURGICAL SUPPLY — 44 items
BANDAGE ESMARK 6X9 LF (GAUZE/BANDAGES/DRESSINGS) IMPLANT
BLADE SAW SGTL HD 18.5X60.5X1. (BLADE) ×3 IMPLANT
BLADE SURG 10 STRL SS (BLADE) IMPLANT
BNDG COHESIVE 4X5 TAN STRL (GAUZE/BANDAGES/DRESSINGS) ×3 IMPLANT
BNDG ESMARK 6X9 LF (GAUZE/BANDAGES/DRESSINGS)
BNDG GAUZE ELAST 4 BULKY (GAUZE/BANDAGES/DRESSINGS) ×6 IMPLANT
COTTON STERILE ROLL (GAUZE/BANDAGES/DRESSINGS) ×3 IMPLANT
COVER MAYO STAND STRL (DRAPES) IMPLANT
COVER SURGICAL LIGHT HANDLE (MISCELLANEOUS) ×6 IMPLANT
DRAPE INCISE IOBAN 66X45 STRL (DRAPES) ×3 IMPLANT
DRAPE OEC MINIVIEW 54X84 (DRAPES) IMPLANT
DRAPE U-SHAPE 47X51 STRL (DRAPES) ×3 IMPLANT
DRSG ADAPTIC 3X8 NADH LF (GAUZE/BANDAGES/DRESSINGS) ×3 IMPLANT
DRSG PAD ABDOMINAL 8X10 ST (GAUZE/BANDAGES/DRESSINGS) ×3 IMPLANT
DURAPREP 26ML APPLICATOR (WOUND CARE) ×3 IMPLANT
ELECT REM PT RETURN 9FT ADLT (ELECTROSURGICAL) ×3
ELECTRODE REM PT RTRN 9FT ADLT (ELECTROSURGICAL) ×1 IMPLANT
GAUZE SPONGE 4X4 12PLY STRL (GAUZE/BANDAGES/DRESSINGS) ×3 IMPLANT
GLOVE BIOGEL PI IND STRL 9 (GLOVE) ×1 IMPLANT
GLOVE BIOGEL PI INDICATOR 9 (GLOVE) ×2
GLOVE SURG ORTHO 9.0 STRL STRW (GLOVE) ×3 IMPLANT
GOWN STRL REUS W/ TWL XL LVL3 (GOWN DISPOSABLE) ×3 IMPLANT
GOWN STRL REUS W/TWL XL LVL3 (GOWN DISPOSABLE) ×6
GUIDEWIRE 1.6 (WIRE) ×4
GUIDEWIRE ORTH 220X1.6XTROC (WIRE) ×2 IMPLANT
KIT BASIN OR (CUSTOM PROCEDURE TRAY) ×3 IMPLANT
KIT ROOM TURNOVER OR (KITS) ×3 IMPLANT
MANIFOLD NEPTUNE II (INSTRUMENTS) ×3 IMPLANT
NS IRRIG 1000ML POUR BTL (IV SOLUTION) ×3 IMPLANT
PACK ORTHO EXTREMITY (CUSTOM PROCEDURE TRAY) ×3 IMPLANT
PAD ARMBOARD 7.5X6 YLW CONV (MISCELLANEOUS) ×6 IMPLANT
PAD CAST 4YDX4 CTTN HI CHSV (CAST SUPPLIES) ×1 IMPLANT
PADDING CAST COTTON 4X4 STRL (CAST SUPPLIES) ×2
SCREW COMP HEADLEASS 4.5X50 (Screw) ×3 IMPLANT
SCREW HEADLESS 4.5X58 (Screw) ×3 IMPLANT
SPONGE LAP 18X18 X RAY DECT (DISPOSABLE) ×3 IMPLANT
SUCTION FRAZIER HANDLE 10FR (MISCELLANEOUS) ×2
SUCTION TUBE FRAZIER 10FR DISP (MISCELLANEOUS) ×1 IMPLANT
SUT ETHILON 2 0 PSLX (SUTURE) ×9 IMPLANT
TOWEL OR 17X24 6PK STRL BLUE (TOWEL DISPOSABLE) ×3 IMPLANT
TOWEL OR 17X26 10 PK STRL BLUE (TOWEL DISPOSABLE) ×3 IMPLANT
TUBE CONNECTING 12'X1/4 (SUCTIONS) ×1
TUBE CONNECTING 12X1/4 (SUCTIONS) ×2 IMPLANT
WATER STERILE IRR 1000ML POUR (IV SOLUTION) ×3 IMPLANT

## 2015-12-18 NOTE — Transfer of Care (Signed)
Immediate Anesthesia Transfer of Care Note  Patient: Tanner CoffinWilliam M Dimock  Procedure(s) Performed: Procedure(s): Left Great Toe Metatarsal , Cunieform Fusion, Lisfranc Fusion (Left)  Patient Location: PACU  Anesthesia Type:MAC combined with regional for post-op pain  Level of Consciousness: awake, alert  and oriented  Airway & Oxygen Therapy: Patient Spontanous Breathing and Patient connected to nasal cannula oxygen  Post-op Assessment: Report given to RN and Post -op Vital signs reviewed and stable  Post vital signs: Reviewed and stable  Last Vitals:  Filed Vitals:   12/18/15 1340 12/18/15 1607  BP:  130/71  Pulse: 68   Temp:  36.6 C  Resp: 14     Last Pain: There were no vitals filed for this visit.       Complications: No apparent anesthesia complications

## 2015-12-18 NOTE — H&P (Signed)
Tanner Santana is an 65 y.o. male.   Chief Complaint: Neuropathic Lisfranc fracture dislocation left foot HPI: Patient is a 65 year old gentleman with destructive changes across the base of the first metatarsal and Lisfranc complex patient has changes consistent with a Charcot arthropathy. Due to failure of conservative care patient presents at this time for internal fixation.  Past Medical History  Diagnosis Date  . Asthma   . GERD (gastroesophageal reflux disease)   . Hemorrhoids   . Neuropathy (HCC)     Hands Feet  . History of MRSA infection     requiring amputation  . History of glaucoma     resolved with cataract surgery  . Hepatitis C     treated in 2014  . Constipation   . Arthritis   . Hypertension     Past Surgical History  Procedure Laterality Date  . Toe amputation  03/15/12, 05/14/12    2nd toe left foot, and 5th toe right foot  . Transurethral resection of prostate    . Nasal sinus surgery      4 times  . Appendectomy  12/70  . Cataract extraction  2/12, 4/12  . Lumbar laminectomy/decompression microdiscectomy  06/26/2012    Procedure: LUMBAR LAMINECTOMY/DECOMPRESSION MICRODISCECTOMY 1 LEVEL;  Surgeon: Barnett AbuHenry Elsner, MD;  Location: [facility];  Service: Neurosurgery;  Laterality: Left;  Left Lumbar four-five Laminectomy/foraminotomy/microscope  . Back surgery    . Eye surgery Bilateral     with lens  . Colonoscopy  04/2015  . Upper gastrointestinal endoscopy      History reviewed. No pertinent family history. Social History:  reports that he quit smoking about 21 years ago. He does not have any smokeless tobacco history on file. He reports that he drinks about 0.6 oz of alcohol per week. He reports that he does not use illicit drugs.  Allergies:  Allergies  Allergen Reactions  . Augmentin [Amoxicillin-Pot Clavulanate] Diarrhea and Other (See Comments)    Colitis - severe diarrhea Has patient had a PCN reaction causing immediate rash,  facial/tongue/throat swelling, SOB or lightheadedness with hypotension: no Has patient had a PCN reaction causing severe rash involving mucus membranes or skin necrosis: no Has patient had a PCN reaction that required hospitalization: yes - had to go to emergency room Has patient had a PCN reaction occurring within the last 10 years: yes If all of the above answers are "NO", then may proceed with Cephalosporin use.   . Ceftin [Cefuroxime Axetil] Diarrhea and Other (See Comments)    Colitis - severe diarrhea    No prescriptions prior to admission    No results found for this or any previous visit (from the past 48 hour(s)). No results found.  Review of Systems  All other systems reviewed and are negative.   Height 6\' 4"  (1.93 m), weight 82.101 kg (181 lb). Physical Exam  On examination patient has a palpable pulse. There is no redness no cellulitis no signs of infection. Distraction across the Lisfranc joint is painful. Radiographic studies show destructive changes across the Lisfranc complex consistent with Charcot arthropathy. No signs of infection. 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  Plan: We'll plan for open reduction internal fixation across the base of the first metatarsal medial cuneiform and across the Lisfranc complex. Risk and benefits were discussed including infection neurovascular injury nonhealing wound for additional surgery. Patient states he understands wishes to proceed at this time.  [name],[name], MD 12/18/2015, 6:57 AM

## 2015-12-18 NOTE — Anesthesia Preprocedure Evaluation (Addendum)
Anesthesia Evaluation  Patient identified by MRN, date of birth, ID band Patient awake    Reviewed: Allergy & Precautions, NPO status , Patient's Chart, lab work & pertinent test results  Airway Mallampati: II  TM Distance: >3 FB Neck ROM: Full    Dental  (+) Teeth Intact, Dental Advisory Given   Pulmonary former smoker,    Pulmonary exam normal breath sounds clear to auscultation       Cardiovascular hypertension, Pt. on medications Normal cardiovascular exam Rhythm:Regular Rate:Normal     Neuro/Psych Peripheral neuropathy negative psych ROS   GI/Hepatic GERD  Medicated,(+) Hepatitis -, C  Endo/Other  negative endocrine ROS  Renal/GU negative Renal ROS     Musculoskeletal  (+) Arthritis , Osteoarthritis,    Abdominal   Peds  Hematology negative hematology ROS (+)   Anesthesia Other Findings Day of surgery medications reviewed with the patient.  Reproductive/Obstetrics                            Anesthesia Physical Anesthesia Plan  ASA: II  Anesthesia Plan: Regional and MAC   Post-op Pain Management:  Regional for Post-op pain   Induction: Intravenous  Airway Management Planned: Nasal Cannula  Additional Equipment:   Intra-op Plan:   Post-operative Plan:   Informed Consent: I have reviewed the patients History and Physical, chart, labs and discussed the procedure including the risks, benefits and alternatives for the proposed anesthesia with the patient or authorized representative who has indicated his/her understanding and acceptance.   Dental advisory given  Plan Discussed with:   Anesthesia Plan Comments: (Risks/benefits of regional block discussed with patient including risk of bleeding, infection, nerve damage, and possibility of failed block.  Also discussed backup plan of general anesthesia and associated risks.  Patient wishes to proceed.)        Anesthesia  Quick Evaluation

## 2015-12-18 NOTE — Anesthesia Procedure Notes (Addendum)
Anesthesia Regional Block:  Popliteal block  Pre-Anesthetic Checklist: ,, timeout performed, Correct Patient, Correct Site, Correct Laterality, Correct Procedure, Correct Position, site marked, Risks and benefits discussed,  Surgical consent,  Pre-op evaluation,  At surgeon's request and post-op pain management  Laterality: Left  Prep: chloraprep       Needles:  Injection technique: Single-shot  Needle Type: Echogenic Needle     Needle Length: 9cm 9 cm Needle Gauge: 21 and 21 G    Additional Needles:  Procedures: ultrasound guided (picture in chart) Popliteal block Narrative:  Injection made incrementally with aspirations every 5 mL.  Performed by: Personally  Anesthesiologist: Cecile HearingURK, STEPHEN EDWARD  Additional Notes: No pain on injection. No increased resistance to injection. Injection made in 5cc increments.  Good needle visualization.  Patient tolerated procedure well.  Combined popliteal/saphenous nerve block.   Procedure Name: MAC Date/Time: 12/18/2015 3:21 PM Performed by: Marena ChancyBECKNER, Shadi Larner S Pre-anesthesia Checklist: Patient identified, Timeout performed, Emergency Drugs available, Patient being monitored and Suction available Patient Re-evaluated:Patient Re-evaluated prior to inductionOxygen Delivery Method: Simple face mask

## 2015-12-18 NOTE — Op Note (Signed)
12/18/2015  3:58 PM  PATIENT:  Tanner Santana    PRE-OPERATIVE DIAGNOSIS:  Osteoarthritis Left Midfoot  POST-OPERATIVE DIAGNOSIS:  Same  PROCEDURE:  Left Great Toe Metatarsal , Cunieform Fusion, Lisfranc Fusion   SURGEON:  [name],MARCUS V, MD  PHYSICIAN ASSISTANT:None ANESTHESIA:   General  PREOPERATIVE INDICATIONS:  Tanner Santana is a  65 y.o. male with a diagnosis of Osteoarthritis Left Midfoot who failed conservative measures and elected for surgical management.    The risks benefits and alternatives were discussed with the patient preoperatively including but not limited to the risks of infection, bleeding, nerve injury, cardiopulmonary complications, the need for revision surgery, among others, and the patient was willing to proceed.  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 Patient left lower extremity was prepped using DuraPrep draped into a sterile field a timeout was called. The toe was draped out of sterile field internal timeframe patient the toe did not expose this to the Lisfranc wound. A dorsal incision was made in the first webspace blunt dissection was carried down the extensor hallucis longus was retracted laterally the base of the first metatarsal medial cuneiform and Lisfranc complex was debrided of soft Charcot bone. The base of the first metatarsal medial cuneiform joint was reduced stabilized with a K wire and a K wire was then advanced from themedial cuneiform through the second metatarsal and into the third metatarsal to provide additional stability.  This was then secured and compressed with 4.5 headless cannulated screws C-arm fluoroscopy verified alignment. The wound was irrigated with normal saline and the incision was closed using 2-0 nylon. Attention  was then focused on the third toe a V incision was made and the toe was amputated through the MTP joint. Electrocautery was used for hemostasis the wound was irrigated was no signs of infection that involve the MTP joint. The incision was closed using 2-0 nylon. A sterile compressive dressing was applied patient was taken to the PACU in stable condition.

## 2015-12-20 NOTE — Anesthesia Postprocedure Evaluation (Signed)
Anesthesia Post Note  Patient: Tanner CoffinWilliam M Santana  Procedure(s) Performed: Procedure(s) (LRB): Left Great Toe Metatarsal , Cunieform Fusion, Lisfranc Fusion (Left)  Patient location during evaluation: PACU Anesthesia Type: MAC and Regional Level of consciousness: awake and alert Pain management: pain level controlled Vital Signs Assessment: post-procedure vital signs reviewed and stable Respiratory status: spontaneous breathing, nonlabored ventilation, respiratory function stable and patient connected to nasal cannula oxygen Cardiovascular status: stable and blood pressure returned to baseline Anesthetic complications: no    Last Vitals:  Filed Vitals:   12/18/15 1612 12/18/15 1624  BP:    Pulse: 70 60  Temp:    Resp: 13 13    Last Pain: There were no vitals filed for this visit.               Cecile HearingStephen Edward Marquee Fuchs

## 2015-12-21 ENCOUNTER — Encounter (HOSPITAL_COMMUNITY): Payer: Self-pay | Admitting: Orthopedic Surgery

## 2016-05-04 ENCOUNTER — Other Ambulatory Visit (HOSPITAL_COMMUNITY): Payer: Self-pay | Admitting: Family

## 2016-05-23 ENCOUNTER — Encounter (HOSPITAL_COMMUNITY)
Admission: RE | Admit: 2016-05-23 | Discharge: 2016-05-23 | Disposition: A | Discharge status: Home / Self Care | Payer: Medicare Other | Source: Ambulatory Visit | Attending: Orthopedic Surgery | Admitting: Orthopedic Surgery

## 2016-05-23 DIAGNOSIS — Q6689 Other  specified congenital deformities of feet: Secondary | ICD-10-CM | POA: Insufficient documentation

## 2016-05-23 DIAGNOSIS — Z01818 Encounter for other preprocedural examination: Secondary | ICD-10-CM | POA: Diagnosis not present

## 2016-05-23 DIAGNOSIS — R001 Bradycardia, unspecified: Secondary | ICD-10-CM | POA: Diagnosis not present

## 2016-05-23 DIAGNOSIS — I10 Essential (primary) hypertension: Secondary | ICD-10-CM | POA: Diagnosis not present

## 2016-05-23 DIAGNOSIS — Z01812 Encounter for preprocedural laboratory examination: Secondary | ICD-10-CM | POA: Diagnosis not present

## 2016-05-23 DIAGNOSIS — A5216 Charcot's arthropathy (tabetic): Secondary | ICD-10-CM | POA: Diagnosis not present

## 2016-05-23 LAB — BASIC METABOLIC PANEL
ANION GAP: 7 (ref 5–15)
BUN: 15 mg/dL (ref 6–20)
CALCIUM: 9.6 mg/dL (ref 8.9–10.3)
CO2: 25 mmol/L (ref 22–32)
Chloride: 107 mmol/L (ref 101–111)
Creatinine, Ser: 1.31 mg/dL — ABNORMAL HIGH (ref 0.61–1.24)
GFR, EST NON AFRICAN AMERICAN: 55 mL/min — AB (ref >60)
Glucose, Bld: 81 mg/dL (ref 65–99)
Potassium: 5.5 mmol/L — ABNORMAL HIGH (ref 3.5–5.1)
Sodium: 139 mmol/L (ref 135–145)

## 2016-05-23 LAB — SURGICAL PCR SCREEN
MRSA, PCR: POSITIVE — AB
STAPHYLOCOCCUS AUREUS: POSITIVE — AB

## 2016-05-23 LAB — CBC
HCT: 36.1 % — ABNORMAL LOW (ref 39.0–52.0)
Hemoglobin: 11.9 g/dL — ABNORMAL LOW (ref 13.0–17.0)
MCH: 30.1 pg (ref 26.0–34.0)
MCHC: 33 g/dL (ref 30.0–36.0)
MCV: 91.2 fL (ref 78.0–100.0)
PLATELETS: 175 10*3/uL (ref 150–400)
RBC: 3.96 MIL/uL — ABNORMAL LOW (ref 4.22–5.81)
RDW: 14.2 % (ref 11.5–15.5)
WBC: 6.9 10*3/uL (ref 4.0–10.5)

## 2016-05-23 NOTE — Pre-Procedure Instructions (Signed)
    Tanner Santana  05/23/2016      [id number] [place] - [phone] [facility] [address] [facility] [address] [place] [id number] Phone: [phone] Fax: [phone]    Your procedure is scheduled on 05/27/16.  [facility]0 A.M.  Call this number if you have problems the morning of surgery:  [phone]   Remember:  Do not eat food or drink liquids after midnight.  Take these medicines the morning of surgery with A SIP OF WATER --all inhalers,nexium,pain med.,   Do not wear jewelry, make-up or nail polish.  Do not wear lotions, powders, or perfumes, or deoderant.  Do not shave 48 hours prior to surgery.  Men may shave face and neck.  Do not bring valuables to the hospital.  [facility] is not responsible for any belongings or valuables.  Contacts, dentures or bridgework may not be worn into surgery.  Leave your suitcase in the car.  After surgery it may be brought to your room.  For patients admitted to the hospital, discharge time will be determined by your treatment team.  Patients discharged the day of surgery will not be allowed to drive home.   Name and phone number of your driver:   Special instructions:  Do not take any aspirin,anti-inflammatories,vitamins,or herbal supplements 5-7 days prior to surgery.  Please read over the following fact sheets that you were given. MRSA Information

## 2016-05-23 NOTE — Progress Notes (Signed)
Anesthesia Chart Review:  Pt is a 65 year old male scheduled for right foot ORIF lisfranc and base 1st metatarsal, with proximal interphalangeal resection 4th toe on 05/27/2016 with Aldean BakerMarcus Duda, MD.   PMH includes:  HTN, hepatitis C, asthma, GERD. Former smoker. BMI 22  Medications include: albuterol, nexium, lisinopril, asmanex, prednisone.   Preoperative labs reviewed.   - K 5.5 - Cr 1.31, BUN 15. Baseline Cr appears to be around 0.95 (per results in care everywhere).  Will recheck DOS.   EKG 05/23/16: sinus bradycardia (53 bpm).    If labs acceptable DOS, I anticipate pt can proceed as scheduled.   Rica Mastngela Keleigh Kazee, FNP-BC [facility]Anesthesiology Phone: [phone] 05/23/2016 3:36 PM

## 2016-05-27 ENCOUNTER — Ambulatory Visit (HOSPITAL_COMMUNITY)
Admission: RE | Admit: 2016-05-27 | Discharge: 2016-05-27 | Disposition: A | Discharge status: Home / Self Care | Payer: Medicare Other | Source: Ambulatory Visit | Attending: Orthopedic Surgery | Admitting: Orthopedic Surgery

## 2016-05-27 ENCOUNTER — Encounter (HOSPITAL_COMMUNITY): Payer: Self-pay | Admitting: *Deleted

## 2016-05-27 ENCOUNTER — Ambulatory Visit (HOSPITAL_COMMUNITY): Payer: Medicare Other | Admitting: Emergency Medicine

## 2016-05-27 ENCOUNTER — Encounter (HOSPITAL_COMMUNITY)
Admission: RE | Disposition: A | Discharge status: Home / Self Care | Payer: Self-pay | Source: Ambulatory Visit | Attending: Orthopedic Surgery

## 2016-05-27 DIAGNOSIS — J45909 Unspecified asthma, uncomplicated: Secondary | ICD-10-CM | POA: Insufficient documentation

## 2016-05-27 DIAGNOSIS — M205X1 Other deformities of toe(s) (acquired), right foot: Secondary | ICD-10-CM

## 2016-05-27 DIAGNOSIS — E11610 Type 2 diabetes mellitus with diabetic neuropathic arthropathy: Secondary | ICD-10-CM

## 2016-05-27 DIAGNOSIS — Z9849 Cataract extraction status, unspecified eye: Secondary | ICD-10-CM | POA: Insufficient documentation

## 2016-05-27 DIAGNOSIS — M205X2 Other deformities of toe(s) (acquired), left foot: Secondary | ICD-10-CM

## 2016-05-27 DIAGNOSIS — Z87891 Personal history of nicotine dependence: Secondary | ICD-10-CM | POA: Diagnosis not present

## 2016-05-27 DIAGNOSIS — M21531 Acquired clawfoot, right foot: Secondary | ICD-10-CM | POA: Insufficient documentation

## 2016-05-27 DIAGNOSIS — Z880 Allergy status to penicillin: Secondary | ICD-10-CM | POA: Insufficient documentation

## 2016-05-27 DIAGNOSIS — I10 Essential (primary) hypertension: Secondary | ICD-10-CM | POA: Diagnosis not present

## 2016-05-27 DIAGNOSIS — B1920 Unspecified viral hepatitis C without hepatic coma: Secondary | ICD-10-CM | POA: Diagnosis not present

## 2016-05-27 DIAGNOSIS — Z8614 Personal history of Methicillin resistant Staphylococcus aureus infection: Secondary | ICD-10-CM | POA: Insufficient documentation

## 2016-05-27 DIAGNOSIS — K219 Gastro-esophageal reflux disease without esophagitis: Secondary | ICD-10-CM | POA: Insufficient documentation

## 2016-05-27 DIAGNOSIS — M1990 Unspecified osteoarthritis, unspecified site: Secondary | ICD-10-CM | POA: Diagnosis not present

## 2016-05-27 DIAGNOSIS — Z89421 Acquired absence of other right toe(s): Secondary | ICD-10-CM | POA: Diagnosis not present

## 2016-05-27 DIAGNOSIS — Z89422 Acquired absence of other left toe(s): Secondary | ICD-10-CM | POA: Insufficient documentation

## 2016-05-27 DIAGNOSIS — Z888 Allergy status to other drugs, medicaments and biological substances status: Secondary | ICD-10-CM | POA: Insufficient documentation

## 2016-05-27 HISTORY — PX: ORIF ANKLE FRACTURE: SHX5408

## 2016-05-27 LAB — BASIC METABOLIC PANEL
Anion gap: 7 (ref 5–15)
BUN: 14 mg/dL (ref 6–20)
CHLORIDE: 103 mmol/L (ref 101–111)
CO2: 28 mmol/L (ref 22–32)
Calcium: 9.5 mg/dL (ref 8.9–10.3)
Creatinine, Ser: 0.96 mg/dL (ref 0.61–1.24)
GFR calc Af Amer: >60 mL/min (ref >60)
GFR calc non Af Amer: >60 mL/min (ref >60)
GLUCOSE: 89 mg/dL (ref 65–99)
POTASSIUM: 4.3 mmol/L (ref 3.5–5.1)
Sodium: 138 mmol/L (ref 135–145)

## 2016-05-27 SURGERY — OPEN REDUCTION INTERNAL FIXATION (ORIF) ANKLE FRACTURE
Anesthesia: Monitor Anesthesia Care | Site: Foot | Laterality: Right

## 2016-05-27 MED ORDER — MIDAZOLAM HCL 2 MG/2ML IJ SOLN: 0.5000 mg | Freq: Once | INTRAMUSCULAR | Status: DC

## 2016-05-27 MED ORDER — OXYCODONE HCL 5 MG PO TABS: 5.0000 mg | ORAL_TABLET | Freq: Once | ORAL | Status: DC | PRN

## 2016-05-27 MED ORDER — OXYCODONE HCL 5 MG/5ML PO SOLN
5.0000 mg | Freq: Once | ORAL | Status: DC | PRN
Start: 2016-05-27 — End: 2016-05-28

## 2016-05-27 MED ORDER — CLINDAMYCIN PHOSPHATE 900 MG/50ML IV SOLN
900.0000 mg | INTRAVENOUS | Status: AC
  Administered 2016-05-27: 900 mg via INTRAVENOUS
  Filled 2016-05-27: qty 50

## 2016-05-27 MED ORDER — MEPIVACAINE HCL 1.5 % IJ SOLN
INTRAMUSCULAR | Status: DC | PRN
  Administered 2016-05-27: 10 mL via PERINEURAL

## 2016-05-27 MED ORDER — MIDAZOLAM HCL 2 MG/2ML IJ SOLN
INTRAMUSCULAR | Status: AC
  Filled 2016-05-27: qty 2

## 2016-05-27 MED ORDER — MIDAZOLAM HCL 2 MG/2ML IJ SOLN
INTRAMUSCULAR | Status: AC
  Administered 2016-05-27: 2 mg
  Filled 2016-05-27: qty 2

## 2016-05-27 MED ORDER — CHLORHEXIDINE GLUCONATE 4 % EX LIQD: 60.0000 mL | Freq: Once | CUTANEOUS | Status: DC

## 2016-05-27 MED ORDER — 0.9 % SODIUM CHLORIDE (POUR BTL) OPTIME
TOPICAL | Status: DC | PRN
  Administered 2016-05-27: 1000 mL

## 2016-05-27 MED ORDER — FENTANYL CITRATE (PF) 100 MCG/2ML IJ SOLN
INTRAMUSCULAR | Status: AC
  Filled 2016-05-27: qty 2

## 2016-05-27 MED ORDER — PROPOFOL 10 MG/ML IV BOLUS
INTRAVENOUS | Status: AC
  Filled 2016-05-27: qty 20

## 2016-05-27 MED ORDER — PROPOFOL 10 MG/ML IV BOLUS
INTRAVENOUS | Status: DC | PRN
  Administered 2016-05-27: 50 mg via INTRAVENOUS

## 2016-05-27 MED ORDER — FENTANYL CITRATE (PF) 100 MCG/2ML IJ SOLN
INTRAMUSCULAR | Status: DC | PRN
  Administered 2016-05-27 (×4): 50 ug via INTRAVENOUS

## 2016-05-27 MED ORDER — FENTANYL CITRATE (PF) 100 MCG/2ML IJ SOLN
INTRAMUSCULAR | Status: AC
  Administered 2016-05-27: 100 ug
  Filled 2016-05-27: qty 2

## 2016-05-27 MED ORDER — PROPOFOL 500 MG/50ML IV EMUL
INTRAVENOUS | Status: DC | PRN
  Administered 2016-05-27: 50 ug/kg/min via INTRAVENOUS

## 2016-05-27 MED ORDER — FENTANYL CITRATE (PF) 100 MCG/2ML IJ SOLN: 100.0000 ug | Freq: Once | INTRAMUSCULAR | Status: DC

## 2016-05-27 MED ORDER — ROPIVACAINE HCL 7.5 MG/ML IJ SOLN
INTRAMUSCULAR | Status: DC | PRN
  Administered 2016-05-27: 20 mL via PERINEURAL

## 2016-05-27 MED ORDER — LACTATED RINGERS IV SOLN
INTRAVENOUS | Status: DC
  Administered 2016-05-27: 13:00:00 via INTRAVENOUS

## 2016-05-27 MED ORDER — HYDROMORPHONE HCL 1 MG/ML IJ SOLN
0.2500 mg | INTRAMUSCULAR | Status: DC | PRN
  Administered 2016-05-27 (×4): 0.5 mg via INTRAVENOUS

## 2016-05-27 MED ORDER — HYDROMORPHONE HCL 2 MG/ML IJ SOLN
INTRAMUSCULAR | Status: AC
  Filled 2016-05-27: qty 1

## 2016-05-27 MED ORDER — LACTATED RINGERS IV SOLN
INTRAVENOUS | Status: DC | PRN
  Administered 2016-05-27: 14:00:00 via INTRAVENOUS

## 2016-05-27 MED ORDER — ONDANSETRON HCL 4 MG/2ML IJ SOLN
INTRAMUSCULAR | Status: DC | PRN
  Administered 2016-05-27: 4 mg via INTRAVENOUS

## 2016-05-27 MED ORDER — MIDAZOLAM HCL 5 MG/5ML IJ SOLN
INTRAMUSCULAR | Status: DC | PRN
  Administered 2016-05-27: 2 mg via INTRAVENOUS

## 2016-05-27 SURGICAL SUPPLY — 37 items
BNDG COHESIVE 4X5 TAN STRL (GAUZE/BANDAGES/DRESSINGS) ×3 IMPLANT
BNDG GAUZE ELAST 4 BULKY (GAUZE/BANDAGES/DRESSINGS) ×3 IMPLANT
COVER SURGICAL LIGHT HANDLE (MISCELLANEOUS) ×6 IMPLANT
CUFF TOURNIQUET SINGLE 34IN LL (TOURNIQUET CUFF) IMPLANT
DRAPE INCISE IOBAN 66X45 STRL (DRAPES) ×3 IMPLANT
DRAPE OEC MINIVIEW 54X84 (DRAPES) ×3 IMPLANT
DRAPE PROXIMA HALF (DRAPES) ×3 IMPLANT
DRAPE U-SHAPE 47X51 STRL (DRAPES) ×3 IMPLANT
DRSG ADAPTIC 3X8 NADH LF (GAUZE/BANDAGES/DRESSINGS) ×3 IMPLANT
DRSG PAD ABDOMINAL 8X10 ST (GAUZE/BANDAGES/DRESSINGS) ×3 IMPLANT
DURAPREP 26ML APPLICATOR (WOUND CARE) ×3 IMPLANT
ELECT REM PT RETURN 9FT ADLT (ELECTROSURGICAL) ×3
ELECTRODE REM PT RTRN 9FT ADLT (ELECTROSURGICAL) ×1 IMPLANT
GLOVE BIOGEL PI IND STRL 9 (GLOVE) ×1 IMPLANT
GLOVE BIOGEL PI INDICATOR 9 (GLOVE) ×2
GLOVE SURG ORTHO 9.0 STRL STRW (GLOVE) ×3 IMPLANT
GOWN STRL REUS W/ TWL XL LVL3 (GOWN DISPOSABLE) ×2 IMPLANT
GOWN STRL REUS W/TWL XL LVL3 (GOWN DISPOSABLE) ×4
GUIDEWIRE NON THREAD 1.6MM (WIRE) ×9 IMPLANT
KIT BASIN OR (CUSTOM PROCEDURE TRAY) ×3 IMPLANT
KIT ROOM TURNOVER OR (KITS) ×3 IMPLANT
MANIFOLD NEPTUNE II (INSTRUMENTS) ×3 IMPLANT
NS IRRIG 1000ML POUR BTL (IV SOLUTION) ×3 IMPLANT
PACK ORTHO EXTREMITY (CUSTOM PROCEDURE TRAY) ×3 IMPLANT
PAD ARMBOARD 7.5X6 YLW CONV (MISCELLANEOUS) ×6 IMPLANT
PRECISION THIN ×3 IMPLANT
SCREW 4.5MM (Screw) ×6 IMPLANT
SCREW COMPRESISON HL 4.5X44MM (Screw) ×3 IMPLANT
SPONGE GAUZE 4X4 12PLY STER LF (GAUZE/BANDAGES/DRESSINGS) ×3 IMPLANT
SPONGE LAP 18X18 X RAY DECT (DISPOSABLE) ×3 IMPLANT
SUCTION FRAZIER HANDLE 10FR (MISCELLANEOUS) ×2
SUCTION TUBE FRAZIER 10FR DISP (MISCELLANEOUS) ×1 IMPLANT
TOWEL OR 17X24 6PK STRL BLUE (TOWEL DISPOSABLE) ×3 IMPLANT
TOWEL OR 17X26 10 PK STRL BLUE (TOWEL DISPOSABLE) ×3 IMPLANT
TUBE CONNECTING 12'X1/4 (SUCTIONS) ×1
TUBE CONNECTING 12X1/4 (SUCTIONS) ×2 IMPLANT
WIRE COMPR 20 THRD 2.8X200 (WIRE) ×6 IMPLANT

## 2016-05-27 NOTE — Op Note (Signed)
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   WITH PROXIMAL INTERPHALANGEAL RESECTION 4TH TOE RIGHT FOOT and pinning  SURGEON:  Nadara MustardMarcus V Idara Woodside, MD  PHYSICIAN ASSISTANT:None ANESTHESIA:   General  PREOPERATIVE INDICATIONS:  Tanner Santana is a  65 y.o. 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    The risks benefits and alternatives were discussed with the patient preoperatively including but not limited to the risks of infection, bleeding, nerve injury, cardiopulmonary complications, the need for revision surgery, among others, and the patient was willing to proceed.  OPERATIVE IMPLANTS: 4.5 headless cannulated screws and 0.0625 K wire  OPERATIVE FINDINGS: Charcot collapse right Lisfranc joint  OPERATIVE PROCEDURE: Patient brought the operating room after undergoing a regional block and patient's right lower extremity was prepped using DuraPrep draped into a sterile field a timeout was called. A longitudinal dorsal incision was made between the EHL and anterior tibial tendon. This was sharply carried down to the medial cuneiform and base of the first metatarsal and also extended dorsally over the second metatarsal. A ostectomy was performed the medial cuneiform on the plantar aspect followed by a valgus wedge osteotomy through the base of the first metatarsal medial cuneiform joint to close the joint and reduce the valgus deformity through the Lisfranc joint. Reduction external fixator were then placed into the talus and the base of the first  metatarsal the first metatarsal and the medial column was reduced. This was then stabilized with a cannulated headless 4.5 screw distal to proximal from the base of the first metatarsal into the medial cuneiform. 2 separate K wires inserted through the medial cuneiform into the base of the second metatarsal as well as into the base of the second and third metatarsal. 24.5 headless cannulated screws were then used to reduce and compress the Lisfranc complex. The guidewires removed the reduction external fixator was removed. C-arm floss be verified reduction. The skin was closed using 2-0 nylon. Attention was then focused on the fourth toe PIP joint. Dorsal incision was made the extensor retinaculum was elevated protected with retractors and the PIP joint was resected the toe was straightened and this was stabilized with a 0.0625 K wire. C-arm floss be verified reduction.

## 2016-05-27 NOTE — Anesthesia Procedure Notes (Signed)
Procedure Name: MAC Date/Time: 05/27/2016 2:25 PM Performed by: Wray KearnsFOLEY, Shawne Eskelson A Pre-anesthesia Checklist: Patient identified, Emergency Drugs available, Suction available and Patient being monitored Patient Re-evaluated:Patient Re-evaluated prior to inductionOxygen Delivery Method: Simple face mask and Nasal cannula Intubation Type: IV induction Placement Confirmation: positive ETCO2 Dental Injury: Teeth and Oropharynx as per pre-operative assessment

## 2016-05-27 NOTE — Transfer of Care (Signed)
Immediate Anesthesia Transfer of Care Note  Patient: Janice CoffinWilliam M Scaletta  Procedure(s) Performed: Procedure(s): OPEN REDUCTION INTERNAL FIXATION (ORIF) LISFRANC AND BASE 1ST METATARSAL, WITH PROXIMAL INTERPHALANGEAL RESECTION 4TH TOE RIGHT FOOT (Right)  Patient Location: PACU  Anesthesia Type:GA combined with regional for post-op pain  Level of Consciousness: awake, oriented, sedated, patient cooperative and responds to stimulation  Airway & Oxygen Therapy: Patient Spontanous Breathing and Patient connected to face mask oxygen  Post-op Assessment: Report given to RN, Post -op Vital signs reviewed and stable, Patient moving all extremities and Patient moving all extremities X 4  Post vital signs: Reviewed and stable  Last Vitals:  Vitals:   05/27/16 1211  BP: (!) 164/88  Pulse: (!) 56  Resp: 20  Temp: 36.8 C    Last Pain:  Vitals:   05/27/16 1211  TempSrc: Oral         Complications: No apparent anesthesia complications

## 2016-05-27 NOTE — Progress Notes (Signed)
Orthopedic Tech Progress Note Patient Details:  Tanner CoffinWilliam M Santana 04-Aug-1950 [id number]  Ortho Devices Type of Ortho Device: CAM walker Ortho Device/Splint Interventions: Application   Saul FordyceJennifer C Shaterria Sager 05/27/2016, 4:39 PM

## 2016-05-27 NOTE — Anesthesia Procedure Notes (Signed)
Anesthesia Regional Block:  Popliteal block  Pre-Anesthetic Checklist: ,, timeout performed, Correct Patient, Correct Site, Correct Laterality, Correct Procedure, Correct Position, site marked, Risks and benefits discussed,  Surgical consent,  Pre-op evaluation,  At surgeon's request and post-op pain management  Laterality: Lower and Right  Prep: chloraprep       Needles:  Injection technique: Single-shot  Needle Type: Echogenic Stimulator Needle          Additional Needles:  Procedures: ultrasound guided (picture in chart) Popliteal block Narrative:  Injection made incrementally with aspirations every 5 mL.  Performed by: Personally  Anesthesiologist: Jaimin Krupka  Additional Notes: H+P and labs reviewed, risks and benefits discussed with patient, procedure tolerated well without complications      

## 2016-05-27 NOTE — Anesthesia Postprocedure Evaluation (Signed)
Anesthesia Post Note  Patient: Tanner CoffinWilliam M Tiffany  Procedure(s) Performed: Procedure(s) (LRB): OPEN REDUCTION INTERNAL FIXATION (ORIF) LISFRANC AND BASE 1ST METATARSAL, WITH PROXIMAL INTERPHALANGEAL RESECTION 4TH TOE RIGHT FOOT (Right)  Patient location during evaluation: PACU Anesthesia Type: MAC and Regional Level of consciousness: awake and alert Pain management: pain level controlled Vital Signs Assessment: post-procedure vital signs reviewed and stable Respiratory status: spontaneous breathing, nonlabored ventilation, respiratory function stable and patient connected to nasal cannula oxygen Cardiovascular status: stable and blood pressure returned to baseline Anesthetic complications: no    Last Vitals:  Vitals:   05/27/16 1630 05/27/16 1705  BP: 136/78 136/84  Pulse: (!) 47 (!) 51  Resp: 15 18  Temp:      Last Pain:  Vitals:   05/27/16 1705  TempSrc:   PainSc: 1         RLE Motor Response:  (bends knee not moving toes) (05/27/16 1705) RLE Sensation: Decreased;Numbness (05/27/16 1705)      Kennieth RadFitzgerald, Constance Whittle E

## 2016-05-27 NOTE — Anesthesia Preprocedure Evaluation (Addendum)
Anesthesia Evaluation  Patient identified by MRN, date of birth, ID band Patient awake    Reviewed: Allergy & Precautions, NPO status , Patient's Chart, lab work & pertinent test results  History of Anesthesia Complications Negative for: history of anesthetic complications  Airway Mallampati: II  TM Distance: >3 FB Neck ROM: Full    Dental  (+) Teeth Intact, Dental Advisory Given   Pulmonary asthma , former smoker,    Pulmonary exam normal breath sounds clear to auscultation       Cardiovascular hypertension, Pt. on medications Normal cardiovascular exam Rhythm:Regular Rate:Normal     Neuro/Psych Peripheral neuropathy negative neurological ROS  negative psych ROS   GI/Hepatic GERD  Medicated,(+) Hepatitis -, C  Endo/Other  negative endocrine ROS  Renal/GU negative Renal ROS     Musculoskeletal  (+) Arthritis , Osteoarthritis,    Abdominal   Peds  Hematology negative hematology ROS (+)   Anesthesia Other Findings Day of surgery medications reviewed with the patient.  Reproductive/Obstetrics                            Lab Results  Component Value Date   WBC 6.9 05/23/2016   HGB 11.9 (L) 05/23/2016   HCT 36.1 (L) 05/23/2016   MCV 91.2 05/23/2016   PLT 175 05/23/2016   Lab Results  Component Value Date   CREATININE 1.31 (H) 05/23/2016   BUN 15 05/23/2016   NA 139 05/23/2016   K 5.5 (H) 05/23/2016   CL 107 05/23/2016   CO2 25 05/23/2016    Anesthesia Physical  Anesthesia Plan  ASA: II  Anesthesia Plan: Regional and MAC   Post-op Pain Management:  Regional for Post-op pain   Induction: Intravenous  Airway Management Planned: Nasal Cannula  Additional Equipment: None  Intra-op Plan:   Post-operative Plan:   Informed Consent: I have reviewed the patients History and Physical, chart, labs and discussed the procedure including the risks, benefits and alternatives for  the proposed anesthesia with the patient or authorized representative who has indicated his/her understanding and acceptance.   Dental advisory given  Plan Discussed with: CRNA and Surgeon  Anesthesia Plan Comments: (Risks/benefits of regional block discussed with patient including risk of bleeding, infection, nerve damage, and possibility of failed block.  Also discussed backup plan of general anesthesia and associated risks.  Patient wishes to proceed.)       Anesthesia Quick Evaluation

## 2016-05-27 NOTE — H&P (Signed)
Tanner CoffinWilliam M Santana is an 65 y.o. male.   Chief Complaint: Painful Charcot collapse right foot with fixed clawing fourth toe right foot HPI: Patient is a 65 year old gentleman with diabetic insensate neuropathy Charcot collapse bilaterally he is status post stabilization the left foot which she states has improved his function. He wishes to proceed with a Charcot fixation of the Lisfranc joint right foot with open reduction internal fixation also has painful clawing of the fourth toe and wishes to proceed with surgical intervention for PIP resection fourth toe right foot  Past Medical History:  Diagnosis Date  . Arthritis   . Asthma   . Constipation   . GERD (gastroesophageal reflux disease)   . Hemorrhoids   . Hepatitis C    treated in 2014  . History of glaucoma    resolved with cataract surgery  . History of MRSA infection    requiring amputation  . Hypertension   . Neuropathy (HCC)    Hands Feet    Past Surgical History:  Procedure Laterality Date  . APPENDECTOMY  12/70  . BACK SURGERY    . CATARACT EXTRACTION  2/12, 4/12  . COLONOSCOPY  04/2015  . EYE SURGERY Bilateral    with lens  . FOOT ARTHRODESIS Left 12/18/2015   Procedure: Left Great Toe Metatarsal , Cunieform Fusion, Lisfranc Fusion;  Surgeon: Nadara MustardMarcus Duda V, MD;  Location: [facility] OR;  Service: Orthopedics;  Laterality: Left;  . LUMBAR LAMINECTOMY/DECOMPRESSION MICRODISCECTOMY  06/26/2012   Procedure: LUMBAR LAMINECTOMY/DECOMPRESSION MICRODISCECTOMY 1 LEVEL;  Surgeon: Barnett AbuHenry Elsner, MD;  Location: [facility];  Service: Neurosurgery;  Laterality: Left;  Left Lumbar four-five Laminectomy/foraminotomy/microscope  . NASAL SINUS SURGERY     4 times  . TOE AMPUTATION  03/15/12, 05/14/12   2nd toe left foot, and 5th toe right foot  . TRANSURETHRAL RESECTION OF PROSTATE    . UPPER GASTROINTESTINAL ENDOSCOPY      No family history on file. Social History:  reports that he quit smoking about 21 years ago. He has a 42.00 pack-year  smoking history. He does not have any smokeless tobacco history on file. He reports that he drinks about 0.6 oz of alcohol per week . He reports that he does not use drugs.  Allergies:  Allergies  Allergen Reactions  . Augmentin [Amoxicillin-Pot Clavulanate] Diarrhea and Other (See Comments)    Colitis - severe diarrhea Has patient had a PCN reaction causing immediate rash, facial/tongue/throat swelling, SOB or lightheadedness with hypotension: no Has patient had a PCN reaction causing severe rash involving mucus membranes or skin necrosis: no Has patient had a PCN reaction that required hospitalization: yes - had to go to emergency room Has patient had a PCN reaction occurring within the last 10 years: yes If all of the above answers are "NO", then may proceed with Cephalosporin use.   . Ceftin [Cefuroxime Axetil] Diarrhea and Other (See Comments)    Colitis - severe diarrhea    No prescriptions prior to admission.    No results found for this or any previous visit (from the past 48 hour(s)). No results found.  Review of Systems  All other systems reviewed and are negative.   There were no vitals taken for this visit. Physical Exam   Assessment/Plan Assessment: Diabetic insensate neuropathy Charcot collapse right foot with fixed clawing of the fourth toe right foot.  Plan: We'll plan for a fusion across the Lisfranc complex. We'll plan for PIP resection the fourth toe risk and benefits were discussed  including risk of the wound not healing potential for loss of fixation due to the Charcot arthropathy. Patient states he understands wishes to proceed at this time.  Nadara MustardMarcus V Duda, MD 05/27/2016, 7:19 AM

## 2016-05-29 ENCOUNTER — Encounter (HOSPITAL_COMMUNITY): Payer: Self-pay | Admitting: Orthopedic Surgery

## 2016-06-06 ENCOUNTER — Encounter (INDEPENDENT_AMBULATORY_CARE_PROVIDER_SITE_OTHER): Payer: Self-pay | Admitting: Orthopedic Surgery

## 2016-06-06 ENCOUNTER — Ambulatory Visit (INDEPENDENT_AMBULATORY_CARE_PROVIDER_SITE_OTHER): Payer: Medicare Other | Admitting: Orthopedic Surgery

## 2016-06-06 ENCOUNTER — Ambulatory Visit (INDEPENDENT_AMBULATORY_CARE_PROVIDER_SITE_OTHER): Payer: Medicare Other

## 2016-06-06 VITALS — Ht 76.0 in | Wt 179.0 lb

## 2016-06-06 DIAGNOSIS — M25571 Pain in right ankle and joints of right foot: Secondary | ICD-10-CM

## 2016-06-06 DIAGNOSIS — M14672 Charcot's joint, left ankle and foot: Secondary | ICD-10-CM

## 2016-06-06 NOTE — Progress Notes (Signed)
Wound Care Note   Patient: Tanner CoffinWilliam M Santana           Date of Birth: [birth year]           MRN: [id number]             PCP: Theodoro DoingG Lon Casper HarrisonMorgan Jr, MD Visit Date: 06/06/2016   Assessment & Plan: Visit Diagnoses:  1. Pain in right ankle and joints of right foot   2. Charcot's joint, left ankle and foot     Plan: Continue nonweightbearing on the right continue with the fracture boot he may wash the foot with Dial soap and water daily and apply Bactroban to the pin track in the surgical incision follow-up in the office in 1 week to harvest the sutures.   Follow-Up Instructions: Return in about 1 week (around 06/13/2016).  Orders:  Orders Placed This Encounter  Procedures  . XR Foot Complete Right   No orders of the defined types were placed in this encounter.     Procedures: No notes on file   Clinical Data: No additional findings.   No images are attached to the encounter.   Subjective: Chief Complaint  Patient presents with  . Right Foot - Routine Post Op    05/27/16 open reduction internal fixation lisfranc fusion base of the 1st metatarsal and resection 4th toe    Patient is 1 week and 3 days sp an ORIF fusion lisfranc base of the 1st MT resection 4th MT. Stitches and pin intact. Pt is non weight bearing in a wheelchair and fracture boot. Moderate amount of dry bloody drainage. The incision is well approximated. No questions or concerns.     Review of Systems  Miscellaneous:    Objective: Vital Signs: Ht 6\' 4"  (1.93 m)   Wt 179 lb (81.2 kg)   BMI 21.79 kg/m   Physical Exam: Examination of right foot the surgical incisions are well-healed there is no cellulitis no drainage no signs of infection there is no maceration. Bactroban was applied to the pin track in the surgical incision 4 x 4 and an Ace wrap the fracture boot was reapplied.  Specialty Comments: No specialty comments available.   PMFS History: Patient Active Problem List   Diagnosis Date Noted    . 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 Claw toe, acquired, right    Past Medical History:  Diagnosis Date  . Arthritis   . Asthma   . Constipation   . GERD (gastroesophageal reflux disease)   . Hemorrhoids   . Hepatitis C    treated in 2014  . History of glaucoma    resolved with cataract surgery  . History of MRSA infection    requiring amputation  . Hypertension   . Neuropathy (HCC)    Hands Feet    No family history on file. Past Surgical History:  Procedure Laterality Date  . APPENDECTOMY  12/70  . BACK SURGERY    . CATARACT EXTRACTION  2/12, 4/12  . COLONOSCOPY  04/2015  . EYE SURGERY Bilateral    with lens  . FOOT ARTHRODESIS Left 12/18/2015   Procedure: Left Great Toe Metatarsal , Cunieform Fusion, Lisfranc Fusion;  Surgeon: Nadara MustardMarcus Duda V, MD;  Location: [facility];  Service: Orthopedics;  Laterality: Left;  . LUMBAR LAMINECTOMY/DECOMPRESSION MICRODISCECTOMY  06/26/2012   Procedure: LUMBAR LAMINECTOMY/DECOMPRESSION MICRODISCECTOMY 1 LEVEL;  Surgeon: Barnett AbuHenry Elsner, MD;  Location: [facility];  Service: Neurosurgery;  Laterality: Left;  Left  Lumbar four-five Laminectomy/foraminotomy/microscope  . NASAL SINUS SURGERY     4 times  . ORIF ANKLE FRACTURE Right 05/27/2016   Procedure: OPEN REDUCTION INTERNAL FIXATION (ORIF) LISFRANC AND BASE 1ST METATARSAL, WITH PROXIMAL INTERPHALANGEAL RESECTION 4TH TOE RIGHT FOOT;  Surgeon: Nadara MustardMarcus V Duda, MD;  Location: [facility] OR;  Service: Orthopedics;  Laterality: Right;  . TOE AMPUTATION  03/15/12, 05/14/12   2nd toe left foot, and 5th toe right foot  . TRANSURETHRAL RESECTION OF PROSTATE    . UPPER GASTROINTESTINAL ENDOSCOPY     Social History   Occupational History  . Not on file.   Social History Main Topics  . Smoking status: Former Smoker    Packs/day: 1.50    Years: 28.00    Quit date: 07/18/1994  . Smokeless tobacco: Never Used  . Alcohol use 0.6 oz/week    1 Cans of beer per week     Comment:  May  1 a week  . Drug use: No  . Sexual activity: Not on file

## 2016-06-13 ENCOUNTER — Encounter (INDEPENDENT_AMBULATORY_CARE_PROVIDER_SITE_OTHER): Payer: Self-pay | Admitting: Orthopedic Surgery

## 2016-06-13 ENCOUNTER — Ambulatory Visit (INDEPENDENT_AMBULATORY_CARE_PROVIDER_SITE_OTHER): Payer: Medicare Other | Admitting: Orthopedic Surgery

## 2016-06-13 VITALS — Ht 77.0 in | Wt 180.0 lb

## 2016-06-13 DIAGNOSIS — M205X1 Other deformities of toe(s) (acquired), right foot: Secondary | ICD-10-CM

## 2016-06-13 DIAGNOSIS — M14671 Charcot's joint, right ankle and foot: Secondary | ICD-10-CM | POA: Insufficient documentation

## 2016-06-13 NOTE — Progress Notes (Signed)
   Post-Op Visit Note   Patient: Tanner CoffinWilliam M Santana           Date of Birth: 13-May-1951           MRN: [id number] Visit Date: 06/13/2016 PCP: Reece AgarG Lon Casper HarrisonMorgan Jr, MD   Assessment & Plan: Continue protected weightbearing with the fracture boot. Dial soap cleansing scar massage. Follow up in 3 weeks with repeat 3 view radiographs of the right foot.  Chief Complaint:  Chief Complaint  Patient presents with  . Right Foot - Follow-up  . Follow-up   Visit Diagnoses:  1. Claw toe, acquired, right   2. Charcot's joint of right foot     Plan: Examination the incision is healing quite nicely the pin was removed the sutures are harvested plan continue Dial soap cleansing dry dressing changes daily.  Follow-Up Instructions: Return in about 3 weeks (around 07/04/2016).   Orders:  No orders of the defined types were placed in this encounter.  No orders of the defined types were placed in this encounter.    PMFS History: Patient Active Problem List   Diagnosis Date Noted  . Charcot's joint of right foot 06/13/2016  . 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 Claw toe, acquired, right    Past Medical History:  Diagnosis Date  . Arthritis   . Asthma   . Constipation   . GERD (gastroesophageal reflux disease)   . Hemorrhoids   . Hepatitis C    treated in 2014  . History of glaucoma    resolved with cataract surgery  . History of MRSA infection    requiring amputation  . Hypertension   . Neuropathy (HCC)    Hands Feet    No family history on file.  Past Surgical History:  Procedure Laterality Date  . APPENDECTOMY  12/70  . BACK SURGERY    . CATARACT EXTRACTION  2/12, 4/12  . COLONOSCOPY  04/2015  . EYE SURGERY Bilateral    with lens  . FOOT ARTHRODESIS Left 12/18/2015   Procedure: Left Great Toe Metatarsal , Cunieform Fusion, Lisfranc Fusion;  Surgeon: Nadara MustardMarcus Duda V, MD;  Location: [facility];  Service: Orthopedics;  Laterality: Left;  . LUMBAR  LAMINECTOMY/DECOMPRESSION MICRODISCECTOMY  06/26/2012   Procedure: LUMBAR LAMINECTOMY/DECOMPRESSION MICRODISCECTOMY 1 LEVEL;  Surgeon: Barnett AbuHenry Elsner, MD;  Location: [facility];  Service: Neurosurgery;  Laterality: Left;  Left Lumbar four-five Laminectomy/foraminotomy/microscope  . NASAL SINUS SURGERY     4 times  . ORIF ANKLE FRACTURE Right 05/27/2016   Procedure: OPEN REDUCTION INTERNAL FIXATION (ORIF) LISFRANC AND BASE 1ST METATARSAL, WITH PROXIMAL INTERPHALANGEAL RESECTION 4TH TOE RIGHT FOOT;  Surgeon: Nadara MustardMarcus V Duda, MD;  Location: [facility];  Service: Orthopedics;  Laterality: Right;  . TOE AMPUTATION  03/15/12, 05/14/12   2nd toe left foot, and 5th toe right foot  . TRANSURETHRAL RESECTION OF PROSTATE    . UPPER GASTROINTESTINAL ENDOSCOPY     Social History   Occupational History  . Not on file.   Social History Main Topics  . Smoking status: Former Smoker    Packs/day: 1.50    Years: 28.00    Quit date: 07/18/1994  . Smokeless tobacco: Never Used  . Alcohol use 0.6 oz/week    1 Cans of beer per week     Comment: May  1 a week  . Drug use: No  . Sexual activity: Not on file

## 2016-06-13 NOTE — Progress Notes (Signed)
Patient states going good. Currently wearing a fracture boot on right.  Incision look good, still has some swelling.  States no soreness. Sutures still intact.

## 2016-06-29 ENCOUNTER — Encounter (HOSPITAL_COMMUNITY): Payer: Self-pay | Admitting: Orthopedic Surgery

## 2016-07-04 ENCOUNTER — Ambulatory Visit (INDEPENDENT_AMBULATORY_CARE_PROVIDER_SITE_OTHER): Payer: Medicare Other | Admitting: Orthopedic Surgery

## 2016-07-04 ENCOUNTER — Ambulatory Visit (INDEPENDENT_AMBULATORY_CARE_PROVIDER_SITE_OTHER): Payer: Medicare Other

## 2016-07-04 DIAGNOSIS — M79671 Pain in right foot: Secondary | ICD-10-CM

## 2016-07-04 DIAGNOSIS — E11610 Type 2 diabetes mellitus with diabetic neuropathic arthropathy: Secondary | ICD-10-CM

## 2016-07-04 NOTE — Progress Notes (Signed)
Office Visit Note   Patient: Tanner Santana           Date of Birth: Nov 28, 1950           MRN: [id number] Visit Date: 07/04/2016              Requested by: Sunnie NielsenGary Lon Morgan, MD [address] [place], [place] [id number] PCP: Theodoro DoingG Lon Casper HarrisonMorgan Jr, MD   Assessment & Plan: Visit Diagnoses:  1. Charcot's arthropathy associated with type 2 diabetes mellitus (HCC)   2. Pain in right foot     Plan: Patient about 6 weeks status post internal fixation for Charcot collapse right foot. Patient will begin weightbearing as tolerated in a stiff soled work boot increase his activities as tolerated follow-up in 4 weeks.  Plan to repeat 3 view radiographs of the right foot follow-up  Follow-Up Instructions: Return in about 4 weeks (around 08/01/2016).   Orders:  Orders Placed This Encounter  Procedures  . XR Foot Complete Right   No orders of the defined types were placed in this encounter.     Procedures: No procedures performed   Clinical Data: No additional findings.   Subjective: Chief Complaint  Patient presents with  . Right Foot - Routine Post Op    ORIF fusion lisfranc and base of 1st MT and stabilization of lisfranc joint 05/27/16    Patient presents for follow up right ORIF fusion lisfranc and base of 1st MT and stabilization of lisfranc joint 05/27/16. He is 5 weeks 3 days post op. He is weightbearing through heel with fracture boot. His incision is well healed. He overall is feeling well.     Review of Systems   Objective: Vital Signs: There were no vitals taken for this visit.  Physical Exam examination the incision is well-healed there is no redness no cellulitis no signs of infection no signs of any acute Charcot arthropathy.  Ortho Exam  Specialty Comments:  No specialty comments available.  Imaging: Xr Foot Complete Right  Result Date: 07/04/2016 Three-view regress the right foot shows stable internal fixation of the internal fixation for  Charcot collapse right foot    PMFS History: Patient Active Problem List   Diagnosis Date Noted  . Charcot's joint of right foot 06/13/2016  . 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 Claw toe, acquired, right    Past Medical History:  Diagnosis Date  . Arthritis   . Asthma   . Constipation   . GERD (gastroesophageal reflux disease)   . Hemorrhoids   . Hepatitis C    treated in 2014  . History of glaucoma    resolved with cataract surgery  . History of MRSA infection    requiring amputation  . Hypertension   . Neuropathy (HCC)    Hands Feet    No family history on file.  Past Surgical History:  Procedure Laterality Date  . APPENDECTOMY  12/70  . BACK SURGERY    . CATARACT EXTRACTION  2/12, 4/12  . COLONOSCOPY  04/2015  . EYE SURGERY Bilateral    with lens  . FOOT ARTHRODESIS Left 12/18/2015   Procedure: Left Great Toe Metatarsal , Cunieform Fusion, Lisfranc Fusion;  Surgeon: Nadara MustardMarcus Eddy Liszewski V, MD;  Location: [facility];  Service: Orthopedics;  Laterality: Left;  . LUMBAR LAMINECTOMY/DECOMPRESSION MICRODISCECTOMY  06/26/2012   Procedure: LUMBAR LAMINECTOMY/DECOMPRESSION MICRODISCECTOMY 1 LEVEL;  Surgeon: Barnett AbuHenry Elsner, MD;  Location: [facility];  Service: Neurosurgery;  Laterality:  Left;  Left Lumbar four-five Laminectomy/foraminotomy/microscope  . NASAL SINUS SURGERY     4 times  . ORIF ANKLE FRACTURE Right 05/27/2016   Procedure: OPEN REDUCTION INTERNAL FIXATION (ORIF) LISFRANC AND BASE 1ST METATARSAL, WITH PROXIMAL INTERPHALANGEAL RESECTION 4TH TOE RIGHT FOOT;  Surgeon: Nadara MustardMarcus V Saheed Carrington, MD;  Location: [facility];  Service: Orthopedics;  Laterality: Right;  . TOE AMPUTATION  03/15/12, 05/14/12   2nd toe left foot, and 5th toe right foot  . TRANSURETHRAL RESECTION OF PROSTATE    . UPPER GASTROINTESTINAL ENDOSCOPY     Social History   Occupational History  . Not on file.   Social History Main Topics  . Smoking status: Former Smoker    Packs/day:  1.50    Years: 28.00    Quit date: 07/18/1994  . Smokeless tobacco: Never Used  . Alcohol use 0.6 oz/week    1 Cans of beer per week     Comment: May  1 a week  . Drug use: No  . Sexual activity: Not on file

## 2016-07-22 ENCOUNTER — Encounter (INDEPENDENT_AMBULATORY_CARE_PROVIDER_SITE_OTHER): Payer: Self-pay | Admitting: Orthopedic Surgery

## 2016-07-22 ENCOUNTER — Ambulatory Visit (INDEPENDENT_AMBULATORY_CARE_PROVIDER_SITE_OTHER): Payer: Medicare Other | Admitting: Orthopedic Surgery

## 2016-07-22 ENCOUNTER — Ambulatory Visit (INDEPENDENT_AMBULATORY_CARE_PROVIDER_SITE_OTHER): Payer: Self-pay

## 2016-07-22 ENCOUNTER — Ambulatory Visit (INDEPENDENT_AMBULATORY_CARE_PROVIDER_SITE_OTHER): Payer: Medicare Other

## 2016-07-22 VITALS — Ht 77.0 in | Wt 180.0 lb

## 2016-07-22 DIAGNOSIS — M79672 Pain in left foot: Secondary | ICD-10-CM | POA: Diagnosis not present

## 2016-07-22 DIAGNOSIS — M79671 Pain in right foot: Secondary | ICD-10-CM | POA: Diagnosis not present

## 2016-07-22 DIAGNOSIS — L97521 Non-pressure chronic ulcer of other part of left foot limited to breakdown of skin: Secondary | ICD-10-CM | POA: Diagnosis not present

## 2016-07-22 NOTE — Progress Notes (Signed)
Office Visit Note   Patient: Tanner CoffinWilliam M Overbeck           Date of Birth: August 30, 1950           MRN: [id number] Visit Date: 07/22/2016              Requested by: Sunnie NielsenGary Lon Morgan, MD [address] [place], [place] [phone] PCP: Theodoro DoingG Lon Casper HarrisonMorgan Jr, MD  Chief Complaint  Patient presents with  . Right Foot - Routine Post Op    ORIF fusion lisfranc and base of 1st MT and stabilization of lisfranc joint 05/27/16    . Left Foot - Pain    HPI: Pt is weight bearing in a regular shoe and a cane on the right side. He states that the surgical foot is doing well and does not have any concerns. He is complaining of a left foot ulcer that began as a blister about I week ago and ruptured. Under the 3rd or 4th MTH there is a yellow soft slightly macerated ulcer that the pt states began as a blister and is now causing shooting stabbing pain. Rodena MedinAutumn L Forrest, RMA    Assessment & Plan: Visit Diagnoses:  1. Pain in right foot   2. Pain in left foot   3. Ulcer of left foot, limited to breakdown of skin (HCC)     Plan: Recommend minimizing weightbearing on the left lower extremity with the new [place] grade 1 ulcer left foot. Follow-up is scheduled in 2 weeks. Continue antibiotic ointment to the ulcer left foot.  Follow-Up Instructions: Return in about 2 weeks (around 08/05/2016).   Ortho Exam Examination patient is alert oriented no not a well-dressed normal affect, respiratory effort he does have an antalgic gait. Examination he has pronation and valgus formerly of both feet with stable Charcot internal fixation no complicating features no cellulitis no signs of infection. He has a new ulcer beneath the second and third metatarsal head left foot. After informed consent a 10 blade knife was used to debride the skin and soft tissue back to bleeding viable granulation tissue. Silver nitrate was used for hemostasis. The ulcers 10 mm in diameter 1 mm deep and this does not probe to bone. There is  no cellulitis no drainage no odor no signs of infection.  Imaging: Xr Foot 2 Views Left  Result Date: 07/22/2016 Three-view radiographs of the left foot shows lucency around the internal fixation. There is no failure of the internal fixation.  Xr Foot Complete Right  Result Date: 07/22/2016 Two-view radiographs of the right foot shows stable internal fixation with prominent screw heads medially. There is no hardware failure there is stable alignment.   Orders:  Orders Placed This Encounter  Procedures  . XR Foot Complete Right  . XR Foot 2 Views Left   No orders of the defined types were placed in this encounter.    Procedures: No procedures performed  Clinical Data: No additional findings.  Subjective: Review of Systems  Objective: Vital Signs: Ht 6\' 5"  (1.956 m)   Wt 180 lb (81.6 kg)   BMI 21.34 kg/m   Specialty Comments:  No specialty comments available.  PMFS History: Patient Active Problem List   Diagnosis Date Noted  . Ulcer of left foot, limited to breakdown of skin (HCC) 07/22/2016  . Charcot's joint of right foot 06/13/2016  . 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 Claw toe, acquired, right    Past Medical History:  Diagnosis Date  . Arthritis   . Asthma   . Constipation   . GERD (gastroesophageal reflux disease)   . Hemorrhoids   . Hepatitis C    treated in 2014  . History of glaucoma    resolved with cataract surgery  . History of MRSA infection    requiring amputation  . Hypertension   . Neuropathy (HCC)    Hands Feet    No family history on file.  Past Surgical History:  Procedure Laterality Date  . APPENDECTOMY  12/70  . BACK SURGERY    . CATARACT EXTRACTION  2/12, 4/12  . COLONOSCOPY  04/2015  . EYE SURGERY Bilateral    with lens  . FOOT ARTHRODESIS Left 12/18/2015   Procedure: Left Great Toe Metatarsal , Cunieform Fusion, Lisfranc Fusion;  Surgeon: Nadara Mustard, MD;  Location: [facility];  Service:  Orthopedics;  Laterality: Left;  . LUMBAR LAMINECTOMY/DECOMPRESSION MICRODISCECTOMY  06/26/2012   Procedure: LUMBAR LAMINECTOMY/DECOMPRESSION MICRODISCECTOMY 1 LEVEL;  Surgeon: Barnett Abu, MD;  Location: [facility];  Service: Neurosurgery;  Laterality: Left;  Left Lumbar four-five Laminectomy/foraminotomy/microscope  . NASAL SINUS SURGERY     4 times  . ORIF ANKLE FRACTURE Right 05/27/2016   Procedure: OPEN REDUCTION INTERNAL FIXATION (ORIF) LISFRANC AND BASE 1ST METATARSAL, WITH PROXIMAL INTERPHALANGEAL RESECTION 4TH TOE RIGHT FOOT;  Surgeon: Nadara Mustard, MD;  Location: [facility];  Service: Orthopedics;  Laterality: Right;  . TOE AMPUTATION  03/15/12, 05/14/12   2nd toe left foot, and 5th toe right foot  . TRANSURETHRAL RESECTION OF PROSTATE    . UPPER GASTROINTESTINAL ENDOSCOPY     Social History   Occupational History  . Not on file.   Social History Main Topics  . Smoking status: Former Smoker    Packs/day: 1.50    Years: 28.00    Quit date: 07/18/1994  . Smokeless tobacco: Never Used  . Alcohol use 0.6 oz/week    1 Cans of beer per week     Comment: May  1 a week  . Drug use: No  . Sexual activity: Not on file

## 2016-08-04 ENCOUNTER — Ambulatory Visit (INDEPENDENT_AMBULATORY_CARE_PROVIDER_SITE_OTHER): Payer: Medicare Other | Admitting: Orthopedic Surgery

## 2016-08-06 ENCOUNTER — Encounter (INDEPENDENT_AMBULATORY_CARE_PROVIDER_SITE_OTHER): Payer: Self-pay | Admitting: Orthopedic Surgery

## 2016-08-06 ENCOUNTER — Ambulatory Visit (INDEPENDENT_AMBULATORY_CARE_PROVIDER_SITE_OTHER): Payer: Medicare Other | Admitting: Orthopedic Surgery

## 2016-08-06 DIAGNOSIS — L97521 Non-pressure chronic ulcer of other part of left foot limited to breakdown of skin: Secondary | ICD-10-CM

## 2016-08-06 DIAGNOSIS — E11610 Type 2 diabetes mellitus with diabetic neuropathic arthropathy: Secondary | ICD-10-CM

## 2016-08-06 NOTE — Progress Notes (Signed)
Office Visit Note   Patient: Tanner Santana           Date of Birth: 07-23-1950           MRN: [id number] Visit Date: 08/06/2016              Requested by: Sunnie Nielsen, MD [address] [place], [place] [address] PCP: Theodoro Doing Casper Harrison, MD  Chief Complaint  Patient presents with  . Right Foot - Routine Post Op  . Left Foot - Wound Check    [id number] post ORIF Lisfranc fracture dislocation right foot on 05/27/2016. HPI  Assessment & Plan: Visit Diagnoses:  1. Charcot's arthropathy associated with type 2 diabetes mellitus (HCC)   2. Ulcer of left foot, limited to breakdown of skin (HCC)     Plan: Follow-up in the office in 4 weeks. Two-view radiographs of the right foot at follow-up. Patient is given instructions and demonstrated heel cord stretching to do bilaterally. Use a Band-Aid over the [place] grade 1 ulcer third and fourth metatarsal head left foot.  Follow-Up Instructions: Return in about 4 weeks (around 09/03/2016).   Ortho Exam Examination patient has heel cord contracture bilaterally worse on the left than the right. Patient was given instructions demonstrated heel cord stretching. He has a Educational psychologist grade 1 ulcer which is 5 mm in diameter 0.1 mm deep which has good granulation tissue he does have prominent metatarsal heads on the left third and fourth. He has heel cord contracture tighter on the left than the right. The surgical incision is well-healing on the right.  Imaging: No results found.  Orders:  No orders of the defined types were placed in this encounter.  No orders of the defined types were placed in this encounter.    Procedures: No procedures performed  Clinical Data: No additional findings.  Subjective: Review of Systems  Objective: Vital Signs: There were no vitals taken for this visit.  Specialty Comments:  No specialty comments available.  PMFS History: Patient Active Problem List   Diagnosis Date Noted  . Ulcer of  left foot, limited to breakdown of skin (HCC) 07/22/2016  . Charcot's joint of right foot 06/13/2016  . 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 Claw toe, acquired, right    Past Medical History:  Diagnosis Date  . Arthritis   . Asthma   . Constipation   . GERD (gastroesophageal reflux disease)   . Hemorrhoids   . Hepatitis C    treated in 2014  . History of glaucoma    resolved with cataract surgery  . History of MRSA infection    requiring amputation  . Hypertension   . Neuropathy (HCC)    Hands Feet    No family history on file.  Past Surgical History:  Procedure Laterality Date  . APPENDECTOMY  12/70  . BACK SURGERY    . CATARACT EXTRACTION  2/12, 4/12  . COLONOSCOPY  04/2015  . EYE SURGERY Bilateral    with lens  . FOOT ARTHRODESIS Left 12/18/2015   Procedure: Left Great Toe Metatarsal , Cunieform Fusion, Lisfranc Fusion;  Surgeon: Nadara Mustard, MD;  Location: [facility];  Service: Orthopedics;  Laterality: Left;  . LUMBAR LAMINECTOMY/DECOMPRESSION MICRODISCECTOMY  06/26/2012   Procedure: LUMBAR LAMINECTOMY/DECOMPRESSION MICRODISCECTOMY 1 LEVEL;  Surgeon: Barnett Abu, MD;  Location: [facility];  Service: Neurosurgery;  Laterality: Left;  Left Lumbar four-five Laminectomy/foraminotomy/microscope  . NASAL SINUS SURGERY  4 times  . ORIF ANKLE FRACTURE Right 05/27/2016   Procedure: OPEN REDUCTION INTERNAL FIXATION (ORIF) LISFRANC AND BASE 1ST METATARSAL, WITH PROXIMAL INTERPHALANGEAL RESECTION 4TH TOE RIGHT FOOT;  Surgeon: Nadara MustardMarcus V Mkenzie Dotts, MD;  Location: [facility];  Service: Orthopedics;  Laterality: Right;  . TOE AMPUTATION  03/15/12, 05/14/12   2nd toe left foot, and 5th toe right foot  . TRANSURETHRAL RESECTION OF PROSTATE    . UPPER GASTROINTESTINAL ENDOSCOPY     Social History   Occupational History  . Not on file.   Social History Main Topics  . Smoking status: Former Smoker    Packs/day: 1.50    Years: 28.00    Quit date: 07/18/1994    . Smokeless tobacco: Never Used  . Alcohol use 0.6 oz/week    1 Cans of beer per week     Comment: May  1 a week  . Drug use: No  . Sexual activity: Not on file

## 2016-09-05 ENCOUNTER — Encounter (INDEPENDENT_AMBULATORY_CARE_PROVIDER_SITE_OTHER): Payer: Self-pay | Admitting: Orthopedic Surgery

## 2016-09-05 ENCOUNTER — Ambulatory Visit (INDEPENDENT_AMBULATORY_CARE_PROVIDER_SITE_OTHER): Payer: Medicare Other

## 2016-09-05 ENCOUNTER — Ambulatory Visit (INDEPENDENT_AMBULATORY_CARE_PROVIDER_SITE_OTHER): Payer: Self-pay

## 2016-09-05 ENCOUNTER — Ambulatory Visit (INDEPENDENT_AMBULATORY_CARE_PROVIDER_SITE_OTHER): Payer: Medicare Other | Admitting: Orthopedic Surgery

## 2016-09-05 ENCOUNTER — Other Ambulatory Visit (INDEPENDENT_AMBULATORY_CARE_PROVIDER_SITE_OTHER): Payer: Self-pay | Admitting: Family

## 2016-09-05 DIAGNOSIS — M79672 Pain in left foot: Secondary | ICD-10-CM

## 2016-09-05 DIAGNOSIS — M79671 Pain in right foot: Secondary | ICD-10-CM | POA: Diagnosis not present

## 2016-09-05 DIAGNOSIS — L97521 Non-pressure chronic ulcer of other part of left foot limited to breakdown of skin: Secondary | ICD-10-CM

## 2016-09-05 DIAGNOSIS — M205X2 Other deformities of toe(s) (acquired), left foot: Secondary | ICD-10-CM

## 2016-09-05 NOTE — Progress Notes (Signed)
Office Visit Note   Patient: Tanner Santana           Date of Birth: 04-May-1951           MRN: [id number] Visit Date: 09/05/2016              Requested by: Sunnie NielsenGary Lon Morgan, MD [address] [place], [place] [phone] PCP: Theodoro DoingG Lon Casper HarrisonMorgan Jr, MD  Chief Complaint  Patient presents with  . Right Foot - Follow-up  . Left Foot - Wound Check    HPI: Patient is 66 y.o male who presents for follow up of left foot. There is left plantar foot ulceration. He complains of pain after prolonged standing.    Patient feels like the ulcer on the plantar aspect left foot got worse when he wore his medical compression socks. Assessment & Plan: Visit Diagnoses:  1. Pain in right foot   2. Pain in left foot   3. Acquired claw toe, left   4. Non-pressure chronic ulcer of other part of left foot limited to breakdown of skin (HCC)     Plan: Patient has chronic draining ulcer beneath the fourth metatarsal head left foot. Patient is a risk of complete break down to the bone with potential risk of a ray amputation. I have recommended proceeding with a Weil osteotomy fourth metatarsal to unload the pressure beneath the fourth metatarsal head. Risks and benefits were discussed including infection nonhealing the wound need for additional surgery. Patient states he understands and wishes to proceed at this time.  Follow-Up Instructions: Return in about 3 weeks (around 09/26/2016).   Ortho Exam Examination patient is alert oriented no adenopathy well-dressed normal affect normal respiratory effort. Examination of both feet is a good dorsalis pedis pulse he has no redness or cellulitis in either foot. No venous stasis ulcers laterally. Exam of the left foot he has maceration around the wound beneath the fourth metatarsal head. After informed consent a 10 blade knife was used to debride the skin and soft tissue back to bleeding viable granulation tissue and this was touched with silver nitrate this did  not probe to bone the ulcers 2 cm in diameter and 2 mm deep. Patient is very prominent palpable fourth metatarsal head. Examination of both feet patient does have some swelling but the Lisfranc fracture dislocation internal fixation incisions are well-healed there is no redness no cellulitis no signs of infection. There is no rocker bottom deformity.  Imaging: Xr Foot 2 Views Left  Result Date: 09/05/2016 Tanner Santana has left foot shows interval healing of the internal fixation Lisfranc fracture dislocation Charcot collapse. Patient does have a long fourth metatarsal.  Xr Foot 2 Views Right  Result Date: 09/05/2016 2 view radiographs of right foot shows interval consolidation of the internal fixation Lisfranc fracture dislocation. No hardware failure there is some lucency around the hardware.   Orders:  Orders Placed This Encounter  Procedures  . XR Foot 2 Views Right  . XR Foot 2 Views Left   No orders of the defined types were placed in this encounter.    Procedures: No procedures performed  Clinical Data: No additional findings.  Subjective: Review of Systems  Objective: Vital Signs: There were no vitals taken for this visit.  Specialty Comments:  No specialty comments available.  PMFS History: Patient Active Problem List   Diagnosis Date Noted  . Non-pressure chronic ulcer of other part of left foot limited to breakdown of skin (HCC) 09/05/2016  .  Ulcer of left foot, limited to breakdown of skin (HCC) 07/22/2016  . Charcot's joint of right foot 06/13/2016  . 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 Acquired claw toe, left    Past Medical History:  Diagnosis Date  . Arthritis   . Asthma   . Constipation   . GERD (gastroesophageal reflux disease)   . Hemorrhoids   . Hepatitis C    treated in 2014  . History of glaucoma    resolved with cataract surgery  . History of MRSA infection    requiring amputation  . Hypertension   .  Neuropathy (HCC)    Hands Feet    No family history on file.  Past Surgical History:  Procedure Laterality Date  . APPENDECTOMY  12/70  . BACK SURGERY    . CATARACT EXTRACTION  2/12, 4/12  . COLONOSCOPY  04/2015  . EYE SURGERY Bilateral    with lens  . FOOT ARTHRODESIS Left 12/18/2015   Procedure: Left Great Toe Metatarsal , Cunieform Fusion, Lisfranc Fusion;  Surgeon: Nadara Mustard, MD;  Location: [facility];  Service: Orthopedics;  Laterality: Left;  . LUMBAR LAMINECTOMY/DECOMPRESSION MICRODISCECTOMY  06/26/2012   Procedure: LUMBAR LAMINECTOMY/DECOMPRESSION MICRODISCECTOMY 1 LEVEL;  Surgeon: Barnett Abu, MD;  Location: [facility];  Service: Neurosurgery;  Laterality: Left;  Left Lumbar four-five Laminectomy/foraminotomy/microscope  . NASAL SINUS SURGERY     4 times  . ORIF ANKLE FRACTURE Right 05/27/2016   Procedure: OPEN REDUCTION INTERNAL FIXATION (ORIF) LISFRANC AND BASE 1ST METATARSAL, WITH PROXIMAL INTERPHALANGEAL RESECTION 4TH TOE RIGHT FOOT;  Surgeon: Nadara Mustard, MD;  Location: [facility];  Service: Orthopedics;  Laterality: Right;  . TOE AMPUTATION  03/15/12, 05/14/12   2nd toe left foot, and 5th toe right foot  . TRANSURETHRAL RESECTION OF PROSTATE    . UPPER GASTROINTESTINAL ENDOSCOPY     Social History   Occupational History  . Not on file.   Social History Main Topics  . Smoking status: Former Smoker    Packs/day: 1.50    Years: 28.00    Quit date: 07/18/1994  . Smokeless tobacco: Never Used  . Alcohol use 0.6 oz/week    1 Cans of beer per week     Comment: May  1 a week  . Drug use: No  . Sexual activity: Not on file

## 2016-09-13 ENCOUNTER — Encounter (HOSPITAL_COMMUNITY): Payer: Self-pay | Admitting: *Deleted

## 2016-09-13 MED ORDER — CLINDAMYCIN PHOSPHATE 900 MG/50ML IV SOLN
900.0000 mg | INTRAVENOUS | Status: DC
  Filled 2016-09-13: qty 50

## 2016-09-14 ENCOUNTER — Encounter (HOSPITAL_COMMUNITY)
Admission: RE | Disposition: A | Discharge status: Home / Self Care | Payer: Self-pay | Source: Ambulatory Visit | Attending: Orthopedic Surgery

## 2016-09-14 ENCOUNTER — Encounter (HOSPITAL_COMMUNITY): Payer: Self-pay | Admitting: *Deleted

## 2016-09-14 ENCOUNTER — Ambulatory Visit (HOSPITAL_COMMUNITY)
Admission: RE | Admit: 2016-09-14 | Discharge: 2016-09-14 | Disposition: A | Discharge status: Home / Self Care | Payer: Medicare Other | Source: Ambulatory Visit | Attending: Orthopedic Surgery | Admitting: Orthopedic Surgery

## 2016-09-14 ENCOUNTER — Ambulatory Visit (HOSPITAL_COMMUNITY): Payer: Medicare Other | Admitting: Anesthesiology

## 2016-09-14 DIAGNOSIS — G629 Polyneuropathy, unspecified: Secondary | ICD-10-CM | POA: Diagnosis not present

## 2016-09-14 DIAGNOSIS — Z7951 Long term (current) use of inhaled steroids: Secondary | ICD-10-CM | POA: Diagnosis not present

## 2016-09-14 DIAGNOSIS — Z7952 Long term (current) use of systemic steroids: Secondary | ICD-10-CM | POA: Insufficient documentation

## 2016-09-14 DIAGNOSIS — L97529 Non-pressure chronic ulcer of other part of left foot with unspecified severity: Secondary | ICD-10-CM | POA: Insufficient documentation

## 2016-09-14 DIAGNOSIS — K219 Gastro-esophageal reflux disease without esophagitis: Secondary | ICD-10-CM | POA: Diagnosis not present

## 2016-09-14 DIAGNOSIS — Z79899 Other long term (current) drug therapy: Secondary | ICD-10-CM | POA: Diagnosis not present

## 2016-09-14 DIAGNOSIS — Z880 Allergy status to penicillin: Secondary | ICD-10-CM | POA: Insufficient documentation

## 2016-09-14 DIAGNOSIS — Z8614 Personal history of Methicillin resistant Staphylococcus aureus infection: Secondary | ICD-10-CM | POA: Diagnosis not present

## 2016-09-14 DIAGNOSIS — Z87891 Personal history of nicotine dependence: Secondary | ICD-10-CM | POA: Insufficient documentation

## 2016-09-14 DIAGNOSIS — I70245 Atherosclerosis of native arteries of left leg with ulceration of other part of foot: Secondary | ICD-10-CM | POA: Insufficient documentation

## 2016-09-14 DIAGNOSIS — J45909 Unspecified asthma, uncomplicated: Secondary | ICD-10-CM | POA: Insufficient documentation

## 2016-09-14 DIAGNOSIS — I10 Essential (primary) hypertension: Secondary | ICD-10-CM | POA: Diagnosis not present

## 2016-09-14 DIAGNOSIS — Z79891 Long term (current) use of opiate analgesic: Secondary | ICD-10-CM | POA: Insufficient documentation

## 2016-09-14 DIAGNOSIS — F4310 Post-traumatic stress disorder, unspecified: Secondary | ICD-10-CM | POA: Diagnosis not present

## 2016-09-14 DIAGNOSIS — L97521 Non-pressure chronic ulcer of other part of left foot limited to breakdown of skin: Secondary | ICD-10-CM

## 2016-09-14 DIAGNOSIS — Q667 Congenital pes cavus: Secondary | ICD-10-CM | POA: Diagnosis present

## 2016-09-14 HISTORY — PX: WEIL OSTEOTOMY: SHX5044

## 2016-09-14 HISTORY — DX: Personal history of urinary calculi: Z87.442

## 2016-09-14 HISTORY — DX: Post-traumatic stress disorder, unspecified: F43.10

## 2016-09-14 LAB — BASIC METABOLIC PANEL
ANION GAP: 9 (ref 5–15)
BUN: 19 mg/dL (ref 6–20)
CALCIUM: 9.8 mg/dL (ref 8.9–10.3)
CO2: 28 mmol/L (ref 22–32)
Chloride: 102 mmol/L (ref 101–111)
Creatinine, Ser: 0.97 mg/dL (ref 0.61–1.24)
GFR calc Af Amer: >60 mL/min (ref >60)
GFR calc non Af Amer: >60 mL/min (ref >60)
GLUCOSE: 86 mg/dL (ref 65–99)
POTASSIUM: 4 mmol/L (ref 3.5–5.1)
Sodium: 139 mmol/L (ref 135–145)

## 2016-09-14 LAB — CBC
HCT: 37.5 % — ABNORMAL LOW (ref 39.0–52.0)
Hemoglobin: 12.6 g/dL — ABNORMAL LOW (ref 13.0–17.0)
MCH: 30.4 pg (ref 26.0–34.0)
MCHC: 33.6 g/dL (ref 30.0–36.0)
MCV: 90.6 fL (ref 78.0–100.0)
Platelets: 171 10*3/uL (ref 150–400)
RBC: 4.14 MIL/uL — ABNORMAL LOW (ref 4.22–5.81)
RDW: 13.4 % (ref 11.5–15.5)
WBC: 6.5 10*3/uL (ref 4.0–10.5)

## 2016-09-14 SURGERY — OSTEOTOMY, WEIL
Anesthesia: General | Site: Foot | Laterality: Left

## 2016-09-14 MED ORDER — ONDANSETRON HCL 4 MG/2ML IJ SOLN
INTRAMUSCULAR | Status: DC | PRN
  Administered 2016-09-14: 4 mg via INTRAVENOUS

## 2016-09-14 MED ORDER — MIDAZOLAM HCL 5 MG/5ML IJ SOLN
INTRAMUSCULAR | Status: DC | PRN
  Administered 2016-09-14: 2 mg via INTRAVENOUS

## 2016-09-14 MED ORDER — PROPOFOL 10 MG/ML IV BOLUS
INTRAVENOUS | Status: DC | PRN
  Administered 2016-09-14: 150 mg via INTRAVENOUS

## 2016-09-14 MED ORDER — FENTANYL CITRATE (PF) 100 MCG/2ML IJ SOLN
INTRAMUSCULAR | Status: DC | PRN
  Administered 2016-09-14 (×2): 50 ug via INTRAVENOUS

## 2016-09-14 MED ORDER — OXYCODONE HCL 5 MG PO TABS
ORAL_TABLET | ORAL | Status: AC
  Filled 2016-09-14: qty 1

## 2016-09-14 MED ORDER — LIDOCAINE HCL (CARDIAC) 20 MG/ML IV SOLN
INTRAVENOUS | Status: DC | PRN
  Administered 2016-09-14: 100 mg via INTRATRACHEAL

## 2016-09-14 MED ORDER — ONDANSETRON HCL 4 MG/2ML IJ SOLN: 4.0000 mg | Freq: Four times a day (QID) | INTRAMUSCULAR | Status: DC | PRN

## 2016-09-14 MED ORDER — HYDROMORPHONE HCL 1 MG/ML IJ SOLN: 0.2500 mg | INTRAMUSCULAR | Status: DC | PRN

## 2016-09-14 MED ORDER — PHENYLEPHRINE HCL 10 MG/ML IJ SOLN
INTRAMUSCULAR | Status: DC | PRN
  Administered 2016-09-14: 60 ug via INTRAVENOUS

## 2016-09-14 MED ORDER — FENTANYL CITRATE (PF) 100 MCG/2ML IJ SOLN
INTRAMUSCULAR | Status: AC
  Filled 2016-09-14: qty 2

## 2016-09-14 MED ORDER — OXYCODONE HCL 5 MG/5ML PO SOLN: 5.0000 mg | Freq: Once | ORAL | Status: AC | PRN

## 2016-09-14 MED ORDER — LACTATED RINGERS IV SOLN
INTRAVENOUS | Status: DC
  Administered 2016-09-14: 08:00:00 via INTRAVENOUS

## 2016-09-14 MED ORDER — 0.9 % SODIUM CHLORIDE (POUR BTL) OPTIME
TOPICAL | Status: DC | PRN
  Administered 2016-09-14: 1000 mL

## 2016-09-14 MED ORDER — OXYCODONE HCL 5 MG PO TABS
5.0000 mg | ORAL_TABLET | Freq: Once | ORAL | Status: AC | PRN
  Administered 2016-09-14: 5 mg via ORAL

## 2016-09-14 MED ORDER — MIDAZOLAM HCL 2 MG/2ML IJ SOLN
INTRAMUSCULAR | Status: AC
  Filled 2016-09-14: qty 2

## 2016-09-14 SURGICAL SUPPLY — 50 items
BLADE AVERAGE 25MMX9MM (BLADE) ×2
BLADE AVERAGE 25X9 (BLADE) ×4 IMPLANT
BLADE MINI RND TIP GREEN BEAV (BLADE) IMPLANT
BNDG COHESIVE 1X5 TAN STRL LF (GAUZE/BANDAGES/DRESSINGS) IMPLANT
BNDG COHESIVE 4X5 TAN STRL (GAUZE/BANDAGES/DRESSINGS) ×3 IMPLANT
BNDG COHESIVE 6X5 TAN STRL LF (GAUZE/BANDAGES/DRESSINGS) IMPLANT
BNDG ESMARK 4X9 LF (GAUZE/BANDAGES/DRESSINGS) ×3 IMPLANT
BNDG GAUZE ELAST 4 BULKY (GAUZE/BANDAGES/DRESSINGS) ×3 IMPLANT
BNDG GAUZE STRTCH 6 (GAUZE/BANDAGES/DRESSINGS) ×3 IMPLANT
CORDS BIPOLAR (ELECTRODE) ×3 IMPLANT
COTTON STERILE ROLL (GAUZE/BANDAGES/DRESSINGS) ×3 IMPLANT
COVER SURGICAL LIGHT HANDLE (MISCELLANEOUS) ×6 IMPLANT
CUFF TOURNIQUET SINGLE 18IN (TOURNIQUET CUFF) IMPLANT
CUFF TOURNIQUET SINGLE 24IN (TOURNIQUET CUFF) IMPLANT
DRAPE OEC MINIVIEW 54X84 (DRAPES) ×3 IMPLANT
DRAPE U-SHAPE 47X51 STRL (DRAPES) ×3 IMPLANT
DRSG ADAPTIC 3X8 NADH LF (GAUZE/BANDAGES/DRESSINGS) ×3 IMPLANT
DURAPREP 26ML APPLICATOR (WOUND CARE) ×3 IMPLANT
ELECT REM PT RETURN 9FT ADLT (ELECTROSURGICAL) ×3
ELECTRODE REM PT RTRN 9FT ADLT (ELECTROSURGICAL) ×1 IMPLANT
GAUZE SPONGE 4X4 12PLY STRL (GAUZE/BANDAGES/DRESSINGS) ×3 IMPLANT
GLOVE BIO SURGEON STRL SZ 6.5 (GLOVE) ×2 IMPLANT
GLOVE BIO SURGEON STRL SZ7 (GLOVE) ×3 IMPLANT
GLOVE BIO SURGEONS STRL SZ 6.5 (GLOVE) ×1
GLOVE BIOGEL PI IND STRL 9 (GLOVE) ×1 IMPLANT
GLOVE BIOGEL PI INDICATOR 9 (GLOVE) ×2
GLOVE SURG ORTHO 9.0 STRL STRW (GLOVE) ×3 IMPLANT
GOWN STRL REUS W/ TWL XL LVL3 (GOWN DISPOSABLE) ×2 IMPLANT
GOWN STRL REUS W/TWL XL LVL3 (GOWN DISPOSABLE) ×4
KIT BASIN OR (CUSTOM PROCEDURE TRAY) ×3 IMPLANT
KIT ROOM TURNOVER OR (KITS) ×3 IMPLANT
MANIFOLD NEPTUNE II (INSTRUMENTS) ×3 IMPLANT
NEEDLE HYPO 25GX1X1/2 BEV (NEEDLE) IMPLANT
NS IRRIG 1000ML POUR BTL (IV SOLUTION) ×3 IMPLANT
PACK ORTHO EXTREMITY (CUSTOM PROCEDURE TRAY) ×3 IMPLANT
PAD ARMBOARD 7.5X6 YLW CONV (MISCELLANEOUS) ×6 IMPLANT
PAD CAST 4YDX4 CTTN HI CHSV (CAST SUPPLIES) ×1 IMPLANT
PADDING CAST COTTON 4X4 STRL (CAST SUPPLIES) ×2
SPECIMEN JAR SMALL (MISCELLANEOUS) ×3 IMPLANT
SPONGE GAUZE 4X4 12PLY STER LF (GAUZE/BANDAGES/DRESSINGS) ×3 IMPLANT
SUCTION FRAZIER HANDLE 10FR (MISCELLANEOUS)
SUCTION TUBE FRAZIER 10FR DISP (MISCELLANEOUS) IMPLANT
SUT ETHILON 2 0 FS 18 (SUTURE) ×3 IMPLANT
SUT VIC AB 2-0 FS1 27 (SUTURE) IMPLANT
SYR CONTROL 10ML LL (SYRINGE) IMPLANT
TOWEL OR 17X24 6PK STRL BLUE (TOWEL DISPOSABLE) ×3 IMPLANT
TOWEL OR 17X26 10 PK STRL BLUE (TOWEL DISPOSABLE) ×3 IMPLANT
TUBE CONNECTING 12'X1/4 (SUCTIONS)
TUBE CONNECTING 12X1/4 (SUCTIONS) IMPLANT
WATER STERILE IRR 1000ML POUR (IV SOLUTION) ×3 IMPLANT

## 2016-09-14 NOTE — H&P (Signed)
Tanner Santana is an 66 y.o. male.   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 Patient is undergoing conservative care with debridement pressure unloading and still has persistent ulceration. Presents at this time for a Weil osteotomy of the fourth metatarsal to unload Asher from the ulcerative area.  Past Medical History:  Diagnosis Date  . Arthritis   . Asthma   . Constipation   . GERD (gastroesophageal reflux disease)   . Hemorrhoids   . Hepatitis C    treated in 2014  . History of glaucoma    resolved with cataract surgery  . History of kidney stones    passed  . History of MRSA infection    requiring amputation  . Hypertension   . Neuropathy ([place])    Hands Feet  . PTSD (post-traumatic stress disorder)     Past Surgical History:  Procedure Laterality Date  . APPENDECTOMY  12/70  . BACK SURGERY    . CATARACT EXTRACTION  2/12, 4/12  . COLONOSCOPY  04/2015  . EYE SURGERY Bilateral    with lens  . FOOT ARTHRODESIS Left 12/18/2015   Procedure: Left Great Toe Metatarsal , Cunieform Fusion, Lisfranc Fusion;  Surgeon: Newt Minion, MD;  Location: [facility];  Service: Orthopedics;  Laterality: Left;  . LUMBAR LAMINECTOMY/DECOMPRESSION MICRODISCECTOMY  06/26/2012   Procedure: LUMBAR LAMINECTOMY/DECOMPRESSION MICRODISCECTOMY 1 LEVEL;  Surgeon: Kristeen Miss, MD;  Location: [facility];  Service: Neurosurgery;  Laterality: Left;  Left Lumbar four-five Laminectomy/foraminotomy/microscope  . NASAL SINUS SURGERY     4 times  . ORIF ANKLE FRACTURE Right 05/27/2016   Procedure: OPEN REDUCTION INTERNAL FIXATION (ORIF) LISFRANC AND BASE 1ST METATARSAL, WITH PROXIMAL INTERPHALANGEAL RESECTION 4TH TOE RIGHT FOOT;  Surgeon: Newt Minion, MD;  Location: [facility];  Service: Orthopedics;  Laterality:  Right;  . TOE AMPUTATION  03/15/12, 05/14/12   2nd toe left foot, and 5th toe right foot  . TRANSURETHRAL RESECTION OF PROSTATE    . UPPER GASTROINTESTINAL ENDOSCOPY      History reviewed. No pertinent family history. Social History:  reports that he quit smoking about 22 years ago. He has a 42.00 pack-year smoking history. He has never used smokeless tobacco. He reports that he drinks about 0.6 oz of alcohol per week . He reports that he does not use drugs.  Allergies:  Allergies  Allergen Reactions  . Augmentin [Amoxicillin-Pot Clavulanate] Diarrhea and Other (See Comments)    COLITIS > SEVERE DIARRHEA  Has patient had a PCN reaction causing immediate rash, facial/tongue/throat swelling, SOB or lightheadedness with hypotension: no Has patient had a PCN reaction causing severe rash involving mucus membranes or skin necrosis: no Has patient had a PCN reaction that required hospitalization: #  #  #  YES  #  #  # > EMERGENCY ROOM Has patient had a PCN reaction occurring within the last 10 years: #  #  #  YES  #  #  #  If all of the above answers are "NO", then may proceed w  . Ceftin [Cefuroxime Axetil] Diarrhea and Other (See Comments)    COLITIS > SEVERE DIARRHEA    Medications Prior to Admission  Medication Sig Dispense Refill  . albuterol (PROVENTIL HFA;VENTOLIN HFA) 108 (90 Base) MCG/ACT inhaler Inhale 1-2 puffs into the lungs every 6 (six) hours  as needed for wheezing or shortness of breath.    . Cholecalciferol (VITAMIN D3) 5000 units TABS Take 5,000 Units by mouth daily.    Marland Kitchen esomeprazole (NEXIUM) 20 MG capsule Take 20 mg by mouth daily.     . fluticasone (FLONASE) 50 MCG/ACT nasal spray Place 2 sprays into both nostrils at bedtime.     Marland Kitchen lisinopril (PRINIVIL,ZESTRIL) 20 MG tablet Take 20 mg by mouth daily.    . mometasone (ASMANEX) 220 MCG/INH inhaler Inhale 1 puff into the lungs at bedtime.     Marland Kitchen morphine (MS CONTIN) 60 MG 12 hr tablet Take 60 mg by mouth 2 (two) times daily.      Marland Kitchen oxyCODONE-acetaminophen (PERCOCET) 10-325 MG per tablet Take 1 tablet by mouth every 6 (six) hours as needed (for pain.). For pain     . predniSONE (DELTASONE) 5 MG tablet Take 5 mg by mouth daily.    . Alum & Mag Hydroxide-Simeth (GI COCKTAIL) SUSP suspension Take 15-30 mLs by mouth every 2 (two) hours as needed (for acid reflux).   2  . polyethylene glycol (MIRALAX / GLYCOLAX) packet Take 17 g by mouth daily as needed (constipation).      Results for orders placed or performed during the hospital encounter of 09/14/16 (from the past 48 hour(s))  Basic metabolic panel     Status: None   Collection Time: 09/14/16  7:59 AM  Result Value Ref Range   Sodium 139 135 - 145 mmol/L   Potassium 4.0 3.5 - 5.1 mmol/L   Chloride 102 101 - 111 mmol/L   CO2 28 22 - 32 mmol/L   Glucose, Bld 86 65 - 99 mg/dL   BUN 19 6 - 20 mg/dL   Creatinine, Ser 0.97 0.61 - 1.24 mg/dL   Calcium 9.8 8.9 - 10.3 mg/dL   GFR calc non Af Amer >60 >60 mL/min   GFR calc Af Amer >60 >60 mL/min    Comment: (NOTE) The eGFR has been calculated using the CKD EPI equation. This calculation has not been validated in all clinical situations. eGFR's persistently <60 mL/min signify possible Chronic Kidney Disease.    Anion gap 9 5 - 15  CBC     Status: Abnormal   Collection Time: 09/14/16  7:59 AM  Result Value Ref Range   WBC 6.5 4.0 - 10.5 K/uL   RBC 4.14 (L) 4.22 - 5.81 MIL/uL   Hemoglobin 12.6 (L) 13.0 - 17.0 g/dL   HCT 37.5 (L) 39.0 - 52.0 %   MCV 90.6 78.0 - 100.0 fL   MCH 30.4 26.0 - 34.0 pg   MCHC 33.6 30.0 - 36.0 g/dL   RDW 13.4 11.5 - 15.5 %   Platelets 171 150 - 400 K/uL   No results found.  Review of Systems  All other systems reviewed and are negative.   Blood pressure (!) 144/81, pulse 65, temperature 98 F (36.7 C), resp. rate 16, height 6' 4" (1.93 m), weight 187 lb (84.8 kg), SpO2 98 %. Physical Exam  On examination patient is alert oriented no adenopathy well-dressed normal affect normal S2  effort he has a palpable pulse. Examination is Wagner grade 1 ulcer beneath the fourth metatarsal head this does not probe to bone. Radiograph shows a long fourth metatarsal. Assessment/Plan Assessment: Wagner grade 1 ulcer fourth metatarsal head right foot.  Plan: We'll plan for right foot fourth metatarsal Weil osteotomy. Risk and benefits were discussed including persistent ulceration and infection nonhealing the wound need for  additional surgery. Patient states he understands and wishes to proceed at this time.  Newt Minion, MD 09/14/2016, 8:37 AM

## 2016-09-14 NOTE — Transfer of Care (Signed)
Immediate Anesthesia Transfer of Care Note  Patient: Tanner CoffinWilliam M Santana  Procedure(s) Performed: Procedure(s): WEIL OSTEOTOMY LEFT FOOT 4TH METATARSAL (Left)  Patient Location: PACU  Anesthesia Type:General  Level of Consciousness: awake, alert  and oriented  Airway & Oxygen Therapy: Patient Spontanous Breathing and Patient connected to nasal cannula oxygen  Post-op Assessment: Report given to RN, Post -op Vital signs reviewed and stable and Patient moving all extremities X 4  Post vital signs: Reviewed and stable  Last Vitals:  Vitals:   09/14/16 0806 09/14/16 0931  BP: (!) 144/81 129/73  Pulse: 65 (!) 59  Resp: 16 12  Temp: 36.7 C 36.1 C    Last Pain:  Vitals:   09/14/16 0931  PainSc: (P) Asleep      Patients Stated Pain Goal: 2 (09/14/16 0806)  Complications: No apparent anesthesia complications

## 2016-09-14 NOTE — Progress Notes (Signed)
Orthopedic Tech Progress Note Patient Details:  Tanner CoffinWilliam M Santana 11-Dec-1950 [id number]  Ortho Devices Type of Ortho Device: Postop shoe/boot Ortho Device/Splint Location: lle Ortho Device/Splint Interventions: Application   Nikki Domrawford, Yoshino Broccoli 09/14/2016, 9:55 AM Viewed order from doctor's order list

## 2016-09-14 NOTE — Anesthesia Preprocedure Evaluation (Signed)
Anesthesia Evaluation  Patient identified by MRN, date of birth, ID band Patient awake    Reviewed: Allergy & Precautions, H&P , NPO status , Patient's Chart, lab work & pertinent test results  Airway Mallampati: II   Neck ROM: full    Dental   Pulmonary asthma , former smoker,    breath sounds clear to auscultation       Cardiovascular hypertension,  Rhythm:regular Rate:Normal     Neuro/Psych    GI/Hepatic GERD  ,(+) Hepatitis -, C  Endo/Other  diabetes, Type 2  Renal/GU      Musculoskeletal  (+) Arthritis ,   Abdominal   Peds  Hematology   Anesthesia Other Findings   Reproductive/Obstetrics                             Anesthesia Physical Anesthesia Plan  ASA: II  Anesthesia Plan: General   Post-op Pain Management:    Induction: Intravenous  Airway Management Planned: LMA  Additional Equipment:   Intra-op Plan:   Post-operative Plan:   Informed Consent: I have reviewed the patients History and Physical, chart, labs and discussed the procedure including the risks, benefits and alternatives for the proposed anesthesia with the patient or authorized representative who has indicated his/her understanding and acceptance.     Plan Discussed with: CRNA, Anesthesiologist and Surgeon  Anesthesia Plan Comments:         Anesthesia Quick Evaluation

## 2016-09-14 NOTE — Op Note (Signed)
09/14/2016  9:20 AM  PATIENT:  Tanner Santana    PRE-OPERATIVE DIAGNOSIS:  Ulcer with Claw Toe Left Foot  POST-OPERATIVE DIAGNOSIS:  Same  PROCEDURE:  WEIL OSTEOTOMY LEFT FOOT 4TH METATARSAL  SURGEON:  Nadara MustardMarcus V Maryum Batterson, MD  PHYSICIAN ASSISTANT:None ANESTHESIA:   General  PREOPERATIVE INDICATIONS:  Tanner Santana is a  66 y.o. male with a diagnosis of Ulcer with Claw Toe Left Foot who failed conservative measures and elected for surgical management.    The risks benefits and alternatives were discussed with the patient preoperatively including but not limited to the risks of infection, bleeding, nerve injury, cardiopulmonary complications, the need for revision surgery, among others, and the patient was willing to proceed.  OPERATIVE IMPLANTS: Synthes 2 x 10 mm mini frag screw  OPERATIVE FINDINGS: Ulcer did not extend down to fascia or bone.  OPERATIVE PROCEDURE: Patient was brought the operating room and underwent a general anesthetic. After adequate levels anesthesia obtained patient's left lower extremity was prepped using DuraPrep draped into a sterile field a timeout was called. A dorsal incision was made over the fourth metatarsal MTP joint. Blunt dissection was carried down the extensor tendon was protected the capsule was incised. A Weil osteotomy was performed the metatarsal head was translated proximally the overhanging bone was resected. This was stabilized with a 2 x 10 mm mini frag screw. Hemostasis was obtained the wound was irrigated with normal saline incision closed using 2-0 nylon a sterile dressing was applied patient was  taken the PACU in stable condition.

## 2016-09-14 NOTE — Anesthesia Postprocedure Evaluation (Signed)
Anesthesia Post Note  Patient: Tanner CoffinWilliam M Santana  Procedure(s) Performed: Procedure(s) (LRB): WEIL OSTEOTOMY LEFT FOOT 4TH METATARSAL (Left)  Patient location during evaluation: PACU Anesthesia Type: General Level of consciousness: awake and alert and patient cooperative Pain management: pain level controlled Vital Signs Assessment: post-procedure vital signs reviewed and stable Respiratory status: spontaneous breathing and respiratory function stable Cardiovascular status: stable Anesthetic complications: no       Last Vitals:  Vitals:   09/14/16 1000 09/14/16 1015  BP: 127/78 130/72  Pulse: (!) 56 (!) 57  Resp: 11 13  Temp:  36.6 C    Last Pain:  Vitals:   09/14/16 1015  PainSc: 3                  Yalexa Blust S

## 2016-09-14 NOTE — Anesthesia Procedure Notes (Signed)
Procedure Name: LMA Insertion Date/Time: 09/14/2016 8:58 AM Performed by: Marena ChancyBECKNER, Adreanna Fickel S Pre-anesthesia Checklist: Patient identified, Emergency Drugs available, Suction available and Patient being monitored Patient Re-evaluated:Patient Re-evaluated prior to inductionOxygen Delivery Method: Circle System Utilized Preoxygenation: Pre-oxygenation with 100% oxygen Intubation Type: IV induction Ventilation: Mask ventilation without difficulty LMA: LMA inserted LMA Size: 5.0 Number of attempts: 1 Airway Equipment and Method: Bite block Placement Confirmation: positive ETCO2 Tube secured with: Tape Dental Injury: Teeth and Oropharynx as per pre-operative assessment

## 2016-09-15 ENCOUNTER — Encounter (HOSPITAL_COMMUNITY): Payer: Self-pay | Admitting: Orthopedic Surgery

## 2016-09-28 ENCOUNTER — Ambulatory Visit (INDEPENDENT_AMBULATORY_CARE_PROVIDER_SITE_OTHER): Payer: Medicare Other | Admitting: Orthopedic Surgery

## 2016-09-28 DIAGNOSIS — M205X2 Other deformities of toe(s) (acquired), left foot: Secondary | ICD-10-CM

## 2016-09-28 DIAGNOSIS — L97521 Non-pressure chronic ulcer of other part of left foot limited to breakdown of skin: Secondary | ICD-10-CM

## 2016-09-28 NOTE — Progress Notes (Signed)
Office Visit Note   Patient: Tanner Santana           Date of Birth: 07-May-1951           MRN: [id number] Visit Date: 09/28/2016              Requested by: Sunnie Nielsen, MD [address] [place], [place] [address] PCP: Theodoro Doing Casper Harrison, MD  Chief Complaint  Patient presents with  . Left Foot - Routine Post Op    HPI: Patient is a 66 year old gentleman who is seen today 2 weeks status post Weil osteotomy left foot fourth toe. Does have ulceration beneath the fourth metatarsal head. This is healing. Has been full weightbearing on the left foot with minimal pain.    Assessment & Plan: Visit Diagnoses:  1. Ulcer of left foot, limited to breakdown of skin (HCC)   2. Acquired claw toe, left     Plan: May shower. May get this wet. Cleanse wound daily. Apply dry dressing. Continue weightbearing in a postop shoe. Follow-up in one more week for suture removal.  Follow-Up Instructions: Return in about 1 week (around 10/05/2016) for suture removal.   Ortho Exam Incision is clean dry and intact no surrounding erythema no sign of infection.  Imaging: No results found.  Labs: No results found for: HGBA1C, ESRSEDRATE, CRP, LABURIC, REPTSTATUS, GRAMSTAIN, CULT, LABORGA  Orders:  No orders of the defined types were placed in this encounter.  No orders of the defined types were placed in this encounter.    Procedures: No procedures performed  Clinical Data: No additional findings.  Subjective: Review of Systems  Constitutional: Negative for chills and fever.    Objective: Vital Signs: There were no vitals taken for this visit.  Specialty Comments:  No specialty comments available.  PMFS History: Patient Active Problem List   Diagnosis Date Noted  . Non-pressure chronic ulcer of other part of left foot limited to breakdown of skin (HCC) 09/05/2016  . Ulcer of left foot, limited to breakdown of skin (HCC) 07/22/2016  . Charcot's joint of right foot  06/13/2016  . 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 Acquired claw toe, left    Past Medical History:  Diagnosis Date  . Arthritis   . Asthma   . Constipation   . GERD (gastroesophageal reflux disease)   . Hemorrhoids   . Hepatitis C    treated in 2014  . History of glaucoma    resolved with cataract surgery  . History of kidney stones    passed  . History of MRSA infection    requiring amputation  . Hypertension   . Neuropathy ([facility])    Hands Feet  . PTSD (post-traumatic stress disorder)     No family history on file.  Past Surgical History:  Procedure Laterality Date  . APPENDECTOMY  12/70  . BACK SURGERY    . CATARACT EXTRACTION  2/12, 4/12  . COLONOSCOPY  04/2015  . EYE SURGERY Bilateral    with lens  . FOOT ARTHRODESIS Left 12/18/2015   Procedure: Left Great Toe Metatarsal , Cunieform Fusion, Lisfranc Fusion;  Surgeon: Nadara Mustard, MD;  Location: [facility];  Service: Orthopedics;  Laterality: Left;  . LUMBAR LAMINECTOMY/DECOMPRESSION MICRODISCECTOMY  06/26/2012   Procedure: LUMBAR LAMINECTOMY/DECOMPRESSION MICRODISCECTOMY 1 LEVEL;  Surgeon: Barnett Abu, MD;  Location: [facility];  Service: Neurosurgery;  Laterality: Left;  Left Lumbar four-five Laminectomy/foraminotomy/microscope  . NASAL SINUS SURGERY  4 times  . ORIF ANKLE FRACTURE Right 05/27/2016   Procedure: OPEN REDUCTION INTERNAL FIXATION (ORIF) LISFRANC AND BASE 1ST METATARSAL, WITH PROXIMAL INTERPHALANGEAL RESECTION 4TH TOE RIGHT FOOT;  Surgeon: Nadara MustardMarcus V Duda, MD;  Location: [facility];  Service: Orthopedics;  Laterality: Right;  . TOE AMPUTATION  03/15/12, 05/14/12   2nd toe left foot, and 5th toe right foot  . TRANSURETHRAL RESECTION OF PROSTATE    . UPPER GASTROINTESTINAL ENDOSCOPY    . WEIL OSTEOTOMY Left 09/14/2016   Procedure: WEIL OSTEOTOMY LEFT FOOT 4TH METATARSAL;  Surgeon: Nadara MustardMarcus Duda V, MD;  Location: [facility];  Service: Orthopedics;  Laterality: Left;   Social History     Occupational History  . Not on file.   Social History Main Topics  . Smoking status: Former Smoker    Packs/day: 1.50    Years: 28.00    Quit date: 07/18/1994  . Smokeless tobacco: Never Used  . Alcohol use 0.6 oz/week    1 Cans of beer per week  . Drug use: No  . Sexual activity: Not on file

## 2016-10-05 ENCOUNTER — Ambulatory Visit (INDEPENDENT_AMBULATORY_CARE_PROVIDER_SITE_OTHER): Payer: Medicare Other | Admitting: Orthopedic Surgery

## 2016-10-05 DIAGNOSIS — L97521 Non-pressure chronic ulcer of other part of left foot limited to breakdown of skin: Secondary | ICD-10-CM

## 2016-10-05 NOTE — Progress Notes (Signed)
Office Visit Note   Patient: Tanner Santana           Date of Birth: 1951-01-27           MRN: [id number] Visit Date: 10/05/2016              Requested by: Sunnie Nielsen, MD [address] [place], [place] [address] PCP: Theodoro Doing Casper Harrison, MD  No chief complaint on file.   HPI: Patient is status post Weil osteotomy for left foot fourth metatarsal secondary to chronic plantar ulcer. Patient has no complaints no fever or chills. HPI  Assessment & Plan: Visit Diagnoses:  1. Ulcer of left foot, limited to breakdown of skin (HCC)     Plan: Will patient advanced to regular shoewear follow-up in 3 months. He will follow up sooner if he develops new ulceration.  Follow-Up Instructions: Return in about 3 months (around 01/05/2017).   Ortho Exam Examination patient's plantar ulcer has completely healed status post Weil osteotomy for the fourth metatarsal. The surgical incision is healed there is no redness no cellulitis no drainage no signs of infection. The sutures are removed. ROS: Complete review of systems negative no fever or chills. Imaging: No results found.  Labs: No results found for: HGBA1C, ESRSEDRATE, CRP, LABURIC, REPTSTATUS, GRAMSTAIN, CULT, LABORGA  Orders:  No orders of the defined types were placed in this encounter.  No orders of the defined types were placed in this encounter.    Procedures: No procedures performed  Clinical Data: No additional findings.  Subjective: Review of Systems  Objective: Vital Signs: There were no vitals taken for this visit.  Specialty Comments:  No specialty comments available.  PMFS History: Patient Active Problem List   Diagnosis Date Noted  . Non-pressure chronic ulcer of other part of left foot limited to breakdown of skin (HCC) 09/05/2016  . Ulcer of left foot, limited to breakdown of skin (HCC) 07/22/2016  . Charcot's joint of right foot 06/13/2016  . Charcot's arthropathy associated with type 2  diabetes mellitus (HCC)   . Acquired claw toe, left    Past Medical History:  Diagnosis Date  . Arthritis   . Asthma   . Constipation   . GERD (gastroesophageal reflux disease)   . Hemorrhoids   . Hepatitis C    treated in 2014  . History of glaucoma    resolved with cataract surgery  . History of kidney stones    passed  . History of MRSA infection    requiring amputation  . Hypertension   . Neuropathy ([facility]CC)    Hands Feet  . PTSD (post-traumatic stress disorder)     No family history on file.  Past Surgical History:  Procedure Laterality Date  . APPENDECTOMY  12/70  . BACK SURGERY    . CATARACT EXTRACTION  2/12, 4/12  . COLONOSCOPY  04/2015  . EYE SURGERY Bilateral    with lens  . FOOT ARTHRODESIS Left 12/18/2015   Procedure: Left Great Toe Metatarsal , Cunieform Fusion, Lisfranc Fusion;  Surgeon: Nadara Mustard, MD;  Location: [facility];  Service: Orthopedics;  Laterality: Left;  . LUMBAR LAMINECTOMY/DECOMPRESSION MICRODISCECTOMY  06/26/2012   Procedure: LUMBAR LAMINECTOMY/DECOMPRESSION MICRODISCECTOMY 1 LEVEL;  Surgeon: Barnett Abu, MD;  Location: [facility];  Service: Neurosurgery;  Laterality: Left;  Left Lumbar four-five Laminectomy/foraminotomy/microscope  . NASAL SINUS SURGERY     4 times  . ORIF ANKLE FRACTURE Right 05/27/2016   Procedure: OPEN REDUCTION INTERNAL FIXATION (ORIF)  LISFRANC AND BASE 1ST METATARSAL, WITH PROXIMAL INTERPHALANGEAL RESECTION 4TH TOE RIGHT FOOT;  Surgeon: Nadara MustardMarcus V Duda, MD;  Location: [facility];  Service: Orthopedics;  Laterality: Right;  . TOE AMPUTATION  03/15/12, 05/14/12   2nd toe left foot, and 5th toe right foot  . TRANSURETHRAL RESECTION OF PROSTATE    . UPPER GASTROINTESTINAL ENDOSCOPY    . WEIL OSTEOTOMY Left 09/14/2016   Procedure: WEIL OSTEOTOMY LEFT FOOT 4TH METATARSAL;  Surgeon: Nadara MustardMarcus Duda V, MD;  Location: [facility];  Service: Orthopedics;  Laterality: Left;   Social History   Occupational History  . Not on file.   Social History  Main Topics  . Smoking status: Former Smoker    Packs/day: 1.50    Years: 28.00    Quit date: 07/18/1994  . Smokeless tobacco: Never Used  . Alcohol use 0.6 oz/week    1 Cans of beer per week  . Drug use: No  . Sexual activity: Not on file

## 2016-11-03 ENCOUNTER — Ambulatory Visit (INDEPENDENT_AMBULATORY_CARE_PROVIDER_SITE_OTHER): Payer: Medicare Other | Admitting: Orthopedic Surgery

## 2016-11-03 DIAGNOSIS — L97521 Non-pressure chronic ulcer of other part of left foot limited to breakdown of skin: Secondary | ICD-10-CM | POA: Diagnosis not present

## 2016-11-03 DIAGNOSIS — M25571 Pain in right ankle and joints of right foot: Secondary | ICD-10-CM

## 2016-11-03 DIAGNOSIS — E11610 Type 2 diabetes mellitus with diabetic neuropathic arthropathy: Secondary | ICD-10-CM

## 2016-11-03 MED ORDER — LIDOCAINE HCL 1 % IJ SOLN
2.0000 mL | INTRAMUSCULAR | Status: AC | PRN
  Administered 2016-11-03: 2 mL

## 2016-11-03 MED ORDER — METHYLPREDNISOLONE ACETATE 40 MG/ML IJ SUSP
40.0000 mg | INTRAMUSCULAR | Status: AC | PRN
  Administered 2016-11-03: 40 mg via INTRA_ARTICULAR

## 2016-11-03 NOTE — Progress Notes (Signed)
Office Visit Note   Patient: Tanner Santana           Date of Birth: 1950-08-20           MRN: [id number] Visit Date: 11/03/2016              Requested by: Sunnie Nielsen, MD [address] [place], [place] [address] PCP: Theodoro Doing Casper Harrison, MD  No chief complaint on file.     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 Patient states that due to the chronic pain of the entire foot and ankle E is considering amputation due to the Charcot collapse and inflammation of the ankle.  Assessment & Plan: Visit Diagnoses:  1. Ulcer of left foot, limited to breakdown of skin (HCC)   2. 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 Pain in right ankle and joints of right foot     Plan: The right ankle was injected from the anterior medial portal he tolerated this well the ulcer was debrided of skin and soft tissue on the left foot. Follow-up in June as scheduled. Patient states he would like to consider a transtibial amputation for the painful Charcot arthropathy of the right foot.  Follow-Up Instructions: Return if symptoms worsen or fail to improve.   Ortho Exam  Patient is alert, oriented, no adenopathy, well-dressed, normal affect, normal respiratory effort. Patient has an antalgic gait. Examination of the left foot he has a Wagner grade 1 ulcer beneath the third and fourth metatarsal heads. After informed consent a 10 blade knife was used to debride the skin and soft tissue back to healthy viable granulation tissue this was touched with silver nitrate. The ulcer is 10 x 20 mm a Band-Aid was applied. Examination of the left foot there is no tenderness to palpation  there is no swelling no bruising no signs of a fracture or wound. Examination of the right ankle patient has swelling and tenderness to palpation anteriorly over the ankle joint. Patient's foot is plantar grade but he does have a Charcot deformity through the midfoot and has pain to palpation over all aspects of this foot. There is no redness no cellulitis no signs of infection.  Imaging: No results found.  Labs: No results found for: HGBA1C, ESRSEDRATE, CRP, LABURIC, REPTSTATUS, GRAMSTAIN, CULT, LABORGA  Orders:  No orders of the defined types were placed in this encounter.  No orders of the defined types were placed in this encounter.    Procedures: Medium Joint Inj Date/Time: 11/03/2016 3:23 PM Performed by: DUDA, MARCUS V Authorized by: Nadara Mustard   Consent Given by:  Patient Site marked: the procedure site was marked   Timeout: prior to procedure the correct patient, procedure, and site was verified   Indications:  Pain and diagnostic evaluation Location:  Ankle Site:  R ankle Prep: patient was prepped and draped in usual sterile fashion   Needle Size:  22 G Needle Length:  1.5 inches Approach:  Anteromedial Ultrasound Guided: No   Fluoroscopic Guidance: No   Medications:  2 mL lidocaine 1 %; 40 mg methylPREDNISolone acetate 40 MG/ML Aspiration Attempted: No   Patient  tolerance:  Patient tolerated the procedure well with no immediate complications    Clinical Data: No additional findings.  ROS:  All other systems negative, except as noted in the HPI. Review of Systems  Objective: Vital Signs: There were no vitals taken for this visit.  Specialty Comments:  No specialty comments available.  PMFS History: Patient Active Problem List   Diagnosis Date Noted  . Pain in right ankle and joints of right foot 11/03/2016  . Non-pressure chronic ulcer of other part of left foot limited to breakdown of skin (HCC) 09/05/2016  . Ulcer of left foot, limited to  breakdown of skin (HCC) 07/22/2016  . Charcot's joint of right foot 06/13/2016  . 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 Acquired claw toe, left    Past Medical History:  Diagnosis Date  . Arthritis   . Asthma   . Constipation   . GERD (gastroesophageal reflux disease)   . Hemorrhoids   . Hepatitis C    treated in 2014  . History of glaucoma    resolved with cataract surgery  . History of kidney stones    passed  . History of MRSA infection    requiring amputation  . Hypertension   . Neuropathy (HCC)    Hands Feet  . PTSD (post-traumatic stress disorder)     No family history on file.  Past Surgical History:  Procedure Laterality Date  . APPENDECTOMY  12/70  . BACK SURGERY    . CATARACT EXTRACTION  2/12, 4/12  . COLONOSCOPY  04/2015  . EYE SURGERY Bilateral    with lens  . FOOT ARTHRODESIS Left 12/18/2015   Procedure: Left Great Toe Metatarsal , Cunieform Fusion, Lisfranc Fusion;  Surgeon: Nadara Mustard, MD;  Location: [facility];  Service: Orthopedics;  Laterality: Left;  . LUMBAR LAMINECTOMY/DECOMPRESSION MICRODISCECTOMY  06/26/2012   Procedure: LUMBAR LAMINECTOMY/DECOMPRESSION MICRODISCECTOMY 1 LEVEL;  Surgeon: Barnett Abu, MD;  Location: [facility];  Service: Neurosurgery;  Laterality: Left;  Left Lumbar four-five Laminectomy/foraminotomy/microscope  . NASAL SINUS SURGERY     4 times  . ORIF ANKLE FRACTURE Right 05/27/2016   Procedure: OPEN REDUCTION INTERNAL FIXATION (ORIF) LISFRANC AND BASE 1ST METATARSAL, WITH PROXIMAL INTERPHALANGEAL RESECTION 4TH TOE RIGHT FOOT;  Surgeon: Nadara Mustard, MD;  Location: [facility] OR;  Service: Orthopedics;  Laterality: Right;  . TOE AMPUTATION  03/15/12, 05/14/12   2nd toe left foot, and 5th toe right foot  . TRANSURETHRAL RESECTION OF PROSTATE    . UPPER GASTROINTESTINAL ENDOSCOPY    . WEIL OSTEOTOMY Left 09/14/2016   Procedure: WEIL OSTEOTOMY LEFT FOOT 4TH METATARSAL;  Surgeon: Nadara Mustard, MD;  Location:  [facility];  Service: Orthopedics;  Laterality: Left;   Social History   Occupational History  . Not on file.   Social History Main Topics  . Smoking status: Former Smoker    Packs/day: 1.50    Years: 28.00    Quit date: 07/18/1994  . Smokeless tobacco: Never Used  . Alcohol use 0.6 oz/week    1 Cans of beer per week  . Drug use: No  . Sexual activity: Not on file

## 2016-11-17 ENCOUNTER — Encounter (INDEPENDENT_AMBULATORY_CARE_PROVIDER_SITE_OTHER): Payer: Self-pay | Admitting: Orthopedic Surgery

## 2016-11-17 ENCOUNTER — Ambulatory Visit (INDEPENDENT_AMBULATORY_CARE_PROVIDER_SITE_OTHER): Payer: Medicare Other | Admitting: Orthopedic Surgery

## 2016-11-17 ENCOUNTER — Encounter (INDEPENDENT_AMBULATORY_CARE_PROVIDER_SITE_OTHER): Payer: Self-pay

## 2016-11-17 ENCOUNTER — Ambulatory Visit (INDEPENDENT_AMBULATORY_CARE_PROVIDER_SITE_OTHER): Payer: Medicare Other

## 2016-11-17 DIAGNOSIS — M86272 Subacute osteomyelitis, left ankle and foot: Secondary | ICD-10-CM

## 2016-11-17 NOTE — Progress Notes (Signed)
Office Visit Note   Patient: Tanner Santana           Date of Birth: September 10, 1950           MRN: [id number] Visit Date: 11/17/2016              Requested by: Sunnie Nielsen, MD [address] [place], [place] [address] PCP: Theodoro Doing Casper Harrison, MD  Chief Complaint  Patient presents with  . Left Foot - Wound Check      HPI: Patient is a 66 year old gentleman status post partial foot amputation for infection who presents with a worsening draining ulcer beneath the fourth toe left foot he complains of bloody drainage denies any cellulitis or ovaries been treating with this peroxide antibiotic ointment.  Assessment & Plan: Visit Diagnoses:  1. Subacute osteomyelitis, left ankle and foot (HCC)     Plan: Recommended proceeding with a transmetatarsal amputation we would need to maintain some of the metatarsal heads 2 preserve a normal cascade. We can perform this as an outpatient nonweightbearing after surgery. Risk and benefits were discussed including risk of the wound not healing. Patient states he understands wishes to proceed at this time.  Follow-Up Instructions: Return in about 2 weeks (around 12/01/2016).   Ortho Exam  Patient is alert, oriented, no adenopathy, well-dressed, normal affect, normal respiratory effort. Examination patient is a good dorsalis pedis pulse he has a cellulitis and ulcer which probes down to bone of the fourth toe. Radiographs show destructive changes. There is no ascending cellulitis no abscess.  Imaging: Xr Foot Complete Left  Result Date: 11/17/2016 Radiographs show osteomyelitis involving the fourth toe. He has a very irregular metatarsal head pattern secondary to partial amputations.   Labs: No results found for: HGBA1C, ESRSEDRATE, CRP, LABURIC, REPTSTATUS, GRAMSTAIN, CULT, LABORGA  Orders:  Orders Placed This Encounter  Procedures  . XR Foot Complete Left   No orders of the defined types were placed in this encounter.    Procedures: No procedures performed  Clinical Data: No additional findings.  ROS:  All other systems negative, except as noted in the HPI. Review of Systems  Objective: Vital Signs: Ht 6\' 4"  (1.93 m)   Wt 187 lb (84.8 kg)   BMI 22.76 kg/m   Specialty Comments:  No specialty comments available.  PMFS History: Patient Active Problem List   Diagnosis Date Noted  . Subacute osteomyelitis, left ankle and foot (HCC) 11/17/2016  . Pain in right ankle and joints of right foot 11/03/2016  . Non-pressure chronic ulcer of other part of left foot limited to breakdown of skin (HCC) 09/05/2016  . Ulcer of left foot, limited to breakdown of skin (HCC) 07/22/2016  . Charcot's joint of right foot 06/13/2016  . 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 Acquired claw toe, left    Past Medical History:  Diagnosis Date  . Arthritis   . Asthma   . Constipation   . GERD (gastroesophageal reflux disease)   . Hemorrhoids   . Hepatitis C    treated in 2014  . History of glaucoma    resolved with cataract surgery  . History of kidney stones    passed  . History of MRSA infection    requiring amputation  . Hypertension   . Neuropathy    Hands Feet  . PTSD (post-traumatic stress disorder)     No family history on file.  Past Surgical History:  Procedure Laterality Date  .  APPENDECTOMY  12/70  . BACK SURGERY    . CATARACT EXTRACTION  2/12, 4/12  . COLONOSCOPY  04/2015  . EYE SURGERY Bilateral    with lens  . FOOT ARTHRODESIS Left 12/18/2015   Procedure: Left Great Toe Metatarsal , Cunieform Fusion, Lisfranc Fusion;  Surgeon: Nadara MustardMarcus Alayia Meggison V, MD;  Location: [facility];  Service: Orthopedics;  Laterality: Left;  . LUMBAR LAMINECTOMY/DECOMPRESSION MICRODISCECTOMY  06/26/2012   Procedure: LUMBAR LAMINECTOMY/DECOMPRESSION MICRODISCECTOMY 1 LEVEL;  Surgeon: Barnett AbuHenry Elsner, MD;  Location: [facility];  Service: Neurosurgery;  Laterality: Left;  Left Lumbar four-five  Laminectomy/foraminotomy/microscope  . NASAL SINUS SURGERY     4 times  . ORIF ANKLE FRACTURE Right 05/27/2016   Procedure: OPEN REDUCTION INTERNAL FIXATION (ORIF) LISFRANC AND BASE 1ST METATARSAL, WITH PROXIMAL INTERPHALANGEAL RESECTION 4TH TOE RIGHT FOOT;  Surgeon: Nadara MustardMarcus V Marijah Larranaga, MD;  Location: [facility];  Service: Orthopedics;  Laterality: Right;  . TOE AMPUTATION  03/15/12, 05/14/12   2nd toe left foot, and 5th toe right foot  . TRANSURETHRAL RESECTION OF PROSTATE    . UPPER GASTROINTESTINAL ENDOSCOPY    . WEIL OSTEOTOMY Left 09/14/2016   Procedure: WEIL OSTEOTOMY LEFT FOOT 4TH METATARSAL;  Surgeon: Nadara MustardMarcus Rayford Williamsen V, MD;  Location: [facility];  Service: Orthopedics;  Laterality: Left;   Social History   Occupational History  . Not on file.   Social History Main Topics  . Smoking status: Former Smoker    Packs/day: 1.50    Years: 28.00    Quit date: 07/18/1994  . Smokeless tobacco: Never Used  . Alcohol use 0.6 oz/week    1 Cans of beer per week  . Drug use: No  . Sexual activity: Not on file

## 2016-11-18 ENCOUNTER — Telehealth (INDEPENDENT_AMBULATORY_CARE_PROVIDER_SITE_OTHER): Payer: Self-pay | Admitting: Orthopedic Surgery

## 2016-11-18 NOTE — Telephone Encounter (Signed)
error 

## 2016-11-21 ENCOUNTER — Telehealth (INDEPENDENT_AMBULATORY_CARE_PROVIDER_SITE_OTHER): Payer: Self-pay | Admitting: Orthopedic Surgery

## 2016-11-21 NOTE — Telephone Encounter (Signed)
Mr. Tanner Santana called in to discuss scheduling surgery.  His surgery order was originally for left transmetatarsal amputation through the MTP joint.  He stated that he gave it a lot of thought and decided that he would like to have a below knee amputation instead.  He discussed this with  Dr. Lajoyce Cornersuda and he agreed to change the order to Left BKA.

## 2016-11-23 ENCOUNTER — Telehealth (INDEPENDENT_AMBULATORY_CARE_PROVIDER_SITE_OTHER): Payer: Self-pay | Admitting: Orthopedic Surgery

## 2016-11-23 NOTE — Telephone Encounter (Signed)
PT REQUESTED A CALL BACK AS HE WANTED TO KNOW HOW LONG HIS HOSPITAL STAY WOULD BE POST SURGERY.  [phone]

## 2016-11-24 ENCOUNTER — Encounter (HOSPITAL_COMMUNITY): Payer: Self-pay | Admitting: *Deleted

## 2016-11-24 ENCOUNTER — Other Ambulatory Visit (INDEPENDENT_AMBULATORY_CARE_PROVIDER_SITE_OTHER): Payer: Self-pay | Admitting: Family

## 2016-11-24 NOTE — Telephone Encounter (Signed)
Call patient we could perform this as outpatient he can leave after surgery. If he required he could stay overnight but insurance would not pay for more than overnight observation.

## 2016-11-24 NOTE — Telephone Encounter (Signed)
I called Mr. Tanner Santana and left a message on his voicemail for him to call me back to discuss. He will need stay at least one night in the hospital due to his insurance.

## 2016-11-25 ENCOUNTER — Other Ambulatory Visit (HOSPITAL_COMMUNITY): Payer: Self-pay | Admitting: *Deleted

## 2016-11-25 ENCOUNTER — Ambulatory Visit (HOSPITAL_COMMUNITY): Payer: Medicare Other | Admitting: Anesthesiology

## 2016-11-25 ENCOUNTER — Encounter (HOSPITAL_COMMUNITY)
Admission: RE | Disposition: A | Discharge status: Home / Self Care | Payer: Self-pay | Source: Ambulatory Visit | Attending: Orthopedic Surgery

## 2016-11-25 ENCOUNTER — Inpatient Hospital Stay (HOSPITAL_COMMUNITY)
Admission: RE | Admit: 2016-11-25 | Discharge: 2016-11-28 | DRG: 476 | Disposition: A | Discharge status: Home / Self Care | Payer: Medicare Other | Source: Ambulatory Visit | Attending: Orthopedic Surgery | Admitting: Orthopedic Surgery

## 2016-11-25 ENCOUNTER — Encounter (HOSPITAL_COMMUNITY): Payer: Self-pay | Admitting: *Deleted

## 2016-11-25 DIAGNOSIS — K219 Gastro-esophageal reflux disease without esophagitis: Secondary | ICD-10-CM | POA: Diagnosis present

## 2016-11-25 DIAGNOSIS — Z888 Allergy status to other drugs, medicaments and biological substances status: Secondary | ICD-10-CM | POA: Diagnosis not present

## 2016-11-25 DIAGNOSIS — Z87891 Personal history of nicotine dependence: Secondary | ICD-10-CM | POA: Diagnosis not present

## 2016-11-25 DIAGNOSIS — G629 Polyneuropathy, unspecified: Secondary | ICD-10-CM | POA: Diagnosis present

## 2016-11-25 DIAGNOSIS — IMO0002 Reserved for concepts with insufficient information to code with codable children: Secondary | ICD-10-CM

## 2016-11-25 DIAGNOSIS — M1990 Unspecified osteoarthritis, unspecified site: Secondary | ICD-10-CM | POA: Diagnosis present

## 2016-11-25 DIAGNOSIS — Z881 Allergy status to other antibiotic agents status: Secondary | ICD-10-CM

## 2016-11-25 DIAGNOSIS — M86272 Subacute osteomyelitis, left ankle and foot: Secondary | ICD-10-CM

## 2016-11-25 DIAGNOSIS — I10 Essential (primary) hypertension: Secondary | ICD-10-CM | POA: Diagnosis present

## 2016-11-25 DIAGNOSIS — H409 Unspecified glaucoma: Secondary | ICD-10-CM | POA: Diagnosis present

## 2016-11-25 DIAGNOSIS — Z880 Allergy status to penicillin: Secondary | ICD-10-CM | POA: Diagnosis not present

## 2016-11-25 DIAGNOSIS — M868X7 Other osteomyelitis, ankle and foot: Secondary | ICD-10-CM | POA: Diagnosis present

## 2016-11-25 DIAGNOSIS — B1920 Unspecified viral hepatitis C without hepatic coma: Secondary | ICD-10-CM | POA: Diagnosis present

## 2016-11-25 DIAGNOSIS — J45909 Unspecified asthma, uncomplicated: Secondary | ICD-10-CM | POA: Diagnosis present

## 2016-11-25 HISTORY — PX: AMPUTATION: SHX166

## 2016-11-25 LAB — CBC
HCT: 35.6 % — ABNORMAL LOW (ref 39.0–52.0)
Hemoglobin: 12 g/dL — ABNORMAL LOW (ref 13.0–17.0)
MCH: 31.4 pg (ref 26.0–34.0)
MCHC: 33.7 g/dL (ref 30.0–36.0)
MCV: 93.2 fL (ref 78.0–100.0)
PLATELETS: 171 10*3/uL (ref 150–400)
RBC: 3.82 MIL/uL — AB (ref 4.22–5.81)
RDW: 13.1 % (ref 11.5–15.5)
WBC: 5.9 10*3/uL (ref 4.0–10.5)

## 2016-11-25 LAB — BASIC METABOLIC PANEL
ANION GAP: 8 (ref 5–15)
BUN: 16 mg/dL (ref 6–20)
CALCIUM: 9.4 mg/dL (ref 8.9–10.3)
CO2: 28 mmol/L (ref 22–32)
Chloride: 102 mmol/L (ref 101–111)
Creatinine, Ser: 1.02 mg/dL (ref 0.61–1.24)
GFR calc Af Amer: >60 mL/min (ref >60)
GLUCOSE: 91 mg/dL (ref 65–99)
POTASSIUM: 4.3 mmol/L (ref 3.5–5.1)
SODIUM: 138 mmol/L (ref 135–145)

## 2016-11-25 LAB — SURGICAL PCR SCREEN
MRSA, PCR: POSITIVE — AB
STAPHYLOCOCCUS AUREUS: POSITIVE — AB

## 2016-11-25 SURGERY — AMPUTATION BELOW KNEE
Anesthesia: Regional | Site: Leg Lower | Laterality: Left

## 2016-11-25 MED ORDER — METOCLOPRAMIDE HCL 5 MG PO TABS: 5.0000 mg | ORAL_TABLET | Freq: Three times a day (TID) | ORAL | Status: DC | PRN

## 2016-11-25 MED ORDER — OXYCODONE-ACETAMINOPHEN 10-325 MG PO TABS: 1.0000 | ORAL_TABLET | Freq: Four times a day (QID) | ORAL | Status: DC | PRN

## 2016-11-25 MED ORDER — ALBUTEROL SULFATE (2.5 MG/3ML) 0.083% IN NEBU: 2.5000 mg | INHALATION_SOLUTION | Freq: Four times a day (QID) | RESPIRATORY_TRACT | Status: DC | PRN

## 2016-11-25 MED ORDER — METHOCARBAMOL 500 MG PO TABS
500.0000 mg | ORAL_TABLET | Freq: Four times a day (QID) | ORAL | Status: DC | PRN
  Administered 2016-11-25 – 2016-11-27 (×5): 500 mg via ORAL
  Filled 2016-11-25 (×5): qty 1

## 2016-11-25 MED ORDER — LIDOCAINE HCL (CARDIAC) 20 MG/ML IV SOLN
INTRAVENOUS | Status: DC | PRN
  Administered 2016-11-25: 60 mg via INTRATRACHEAL

## 2016-11-25 MED ORDER — ACETAMINOPHEN 325 MG PO TABS
650.0000 mg | ORAL_TABLET | Freq: Four times a day (QID) | ORAL | Status: DC | PRN
  Administered 2016-11-27 (×3): 650 mg via ORAL
  Filled 2016-11-25 (×3): qty 2

## 2016-11-25 MED ORDER — FENTANYL CITRATE (PF) 100 MCG/2ML IJ SOLN
50.0000 ug | Freq: Once | INTRAMUSCULAR | Status: AC
  Administered 2016-11-25: 50 ug via INTRAVENOUS

## 2016-11-25 MED ORDER — MUPIROCIN 2 % EX OINT
TOPICAL_OINTMENT | Freq: Two times a day (BID) | CUTANEOUS | Status: DC
  Administered 2016-11-25: 22:00:00 via NASAL
  Administered 2016-11-25: 1 via NASAL
  Administered 2016-11-26 – 2016-11-27 (×3): via NASAL
  Administered 2016-11-27 – 2016-11-28 (×2): 1 via NASAL
  Filled 2016-11-25: qty 22

## 2016-11-25 MED ORDER — METOCLOPRAMIDE HCL 5 MG/ML IJ SOLN: 5.0000 mg | Freq: Three times a day (TID) | INTRAMUSCULAR | Status: DC | PRN

## 2016-11-25 MED ORDER — KETOROLAC TROMETHAMINE 15 MG/ML IJ SOLN
7.5000 mg | Freq: Four times a day (QID) | INTRAMUSCULAR | Status: AC
  Administered 2016-11-25 – 2016-11-26 (×3): 7.5 mg via INTRAVENOUS
  Filled 2016-11-25 (×2): qty 1

## 2016-11-25 MED ORDER — MEPERIDINE HCL 25 MG/ML IJ SOLN: 6.2500 mg | INTRAMUSCULAR | Status: DC | PRN

## 2016-11-25 MED ORDER — CLINDAMYCIN PHOSPHATE 600 MG/50ML IV SOLN
600.0000 mg | Freq: Four times a day (QID) | INTRAVENOUS | Status: AC
  Administered 2016-11-25 – 2016-11-26 (×3): 600 mg via INTRAVENOUS
  Filled 2016-11-25 (×3): qty 50

## 2016-11-25 MED ORDER — SODIUM CHLORIDE 0.9 % IV SOLN: INTRAVENOUS | Status: DC

## 2016-11-25 MED ORDER — FENTANYL CITRATE (PF) 100 MCG/2ML IJ SOLN
INTRAMUSCULAR | Status: AC
  Administered 2016-11-25: 50 ug via INTRAVENOUS
  Filled 2016-11-25: qty 2

## 2016-11-25 MED ORDER — OXYCODONE HCL 5 MG PO TABS
5.0000 mg | ORAL_TABLET | Freq: Four times a day (QID) | ORAL | Status: DC | PRN
  Administered 2016-11-25 – 2016-11-28 (×6): 5 mg via ORAL
  Filled 2016-11-25 (×6): qty 1

## 2016-11-25 MED ORDER — ONDANSETRON HCL 4 MG/2ML IJ SOLN
INTRAMUSCULAR | Status: AC
  Filled 2016-11-25: qty 2

## 2016-11-25 MED ORDER — FENTANYL CITRATE (PF) 250 MCG/5ML IJ SOLN
INTRAMUSCULAR | Status: AC
  Filled 2016-11-25: qty 5

## 2016-11-25 MED ORDER — HYDROMORPHONE HCL 1 MG/ML IJ SOLN: 0.2500 mg | INTRAMUSCULAR | Status: DC | PRN

## 2016-11-25 MED ORDER — BISACODYL 10 MG RE SUPP: 10.0000 mg | Freq: Every day | RECTAL | Status: DC | PRN

## 2016-11-25 MED ORDER — MENTHOL 3 MG MT LOZG: 1.0000 | LOZENGE | OROMUCOSAL | Status: DC | PRN

## 2016-11-25 MED ORDER — DOCUSATE SODIUM 100 MG PO CAPS
100.0000 mg | ORAL_CAPSULE | Freq: Two times a day (BID) | ORAL | Status: DC
  Administered 2016-11-25 – 2016-11-28 (×6): 100 mg via ORAL
  Filled 2016-11-25 (×6): qty 1

## 2016-11-25 MED ORDER — DEXTROSE 5 % IV SOLN
500.0000 mg | Freq: Four times a day (QID) | INTRAVENOUS | Status: DC | PRN
  Filled 2016-11-25: qty 5

## 2016-11-25 MED ORDER — TRANEXAMIC ACID 1000 MG/10ML IV SOLN
2000.0000 mg | INTRAVENOUS | Status: AC
  Administered 2016-11-25: 2000 mg via TOPICAL
  Filled 2016-11-25: qty 20

## 2016-11-25 MED ORDER — ONDANSETRON HCL 4 MG PO TABS: 4.0000 mg | ORAL_TABLET | Freq: Four times a day (QID) | ORAL | Status: DC | PRN

## 2016-11-25 MED ORDER — ACETAMINOPHEN 650 MG RE SUPP: 650.0000 mg | Freq: Four times a day (QID) | RECTAL | Status: DC | PRN

## 2016-11-25 MED ORDER — MORPHINE SULFATE ER 100 MG PO TBCR
100.0000 mg | EXTENDED_RELEASE_TABLET | Freq: Two times a day (BID) | ORAL | Status: DC
  Administered 2016-11-25 – 2016-11-28 (×6): 100 mg via ORAL
  Filled 2016-11-25 (×6): qty 1

## 2016-11-25 MED ORDER — HYDROMORPHONE HCL 1 MG/ML IJ SOLN
1.0000 mg | INTRAMUSCULAR | Status: DC | PRN
  Administered 2016-11-26 – 2016-11-28 (×13): 1 mg via INTRAVENOUS
  Filled 2016-11-25 (×14): qty 1

## 2016-11-25 MED ORDER — FLUTICASONE PROPIONATE 50 MCG/ACT NA SUSP
2.0000 | Freq: Every day | NASAL | Status: DC
  Administered 2016-11-25 – 2016-11-26 (×2): 2 via NASAL
  Filled 2016-11-25: qty 16

## 2016-11-25 MED ORDER — MIDAZOLAM HCL 2 MG/2ML IJ SOLN
2.0000 mg | Freq: Once | INTRAMUSCULAR | Status: AC
  Administered 2016-11-25: 2 mg via INTRAVENOUS

## 2016-11-25 MED ORDER — PREDNISONE 5 MG PO TABS
5.0000 mg | ORAL_TABLET | Freq: Every day | ORAL | Status: DC
  Administered 2016-11-25 – 2016-11-28 (×4): 5 mg via ORAL
  Filled 2016-11-25 (×4): qty 1

## 2016-11-25 MED ORDER — OXYCODONE HCL 5 MG/5ML PO SOLN: 5.0000 mg | Freq: Once | ORAL | Status: DC | PRN

## 2016-11-25 MED ORDER — BUDESONIDE 0.25 MG/2ML IN SUSP
0.2500 mg | Freq: Two times a day (BID) | RESPIRATORY_TRACT | Status: DC
  Administered 2016-11-26 – 2016-11-28 (×4): 0.25 mg via RESPIRATORY_TRACT
  Filled 2016-11-25 (×4): qty 2

## 2016-11-25 MED ORDER — FENTANYL CITRATE (PF) 250 MCG/5ML IJ SOLN
INTRAMUSCULAR | Status: DC | PRN
  Administered 2016-11-25: 50 ug via INTRAVENOUS

## 2016-11-25 MED ORDER — PANTOPRAZOLE SODIUM 40 MG PO TBEC
40.0000 mg | DELAYED_RELEASE_TABLET | Freq: Every day | ORAL | Status: DC
  Administered 2016-11-26 – 2016-11-28 (×3): 40 mg via ORAL
  Filled 2016-11-25 (×3): qty 1

## 2016-11-25 MED ORDER — PROPOFOL 10 MG/ML IV BOLUS
INTRAVENOUS | Status: AC
  Filled 2016-11-25: qty 20

## 2016-11-25 MED ORDER — OXYCODONE HCL 5 MG PO TABS: 5.0000 mg | ORAL_TABLET | Freq: Once | ORAL | Status: DC | PRN

## 2016-11-25 MED ORDER — MAGNESIUM CITRATE PO SOLN: 1.0000 | Freq: Once | ORAL | Status: DC | PRN

## 2016-11-25 MED ORDER — LACTATED RINGERS IV SOLN
INTRAVENOUS | Status: DC
  Administered 2016-11-25: 10:00:00 via INTRAVENOUS

## 2016-11-25 MED ORDER — MIDAZOLAM HCL 2 MG/2ML IJ SOLN
INTRAMUSCULAR | Status: AC
  Administered 2016-11-25: 2 mg via INTRAVENOUS
  Filled 2016-11-25: qty 2

## 2016-11-25 MED ORDER — LIDOCAINE 2% (20 MG/ML) 5 ML SYRINGE
INTRAMUSCULAR | Status: AC
  Filled 2016-11-25: qty 5

## 2016-11-25 MED ORDER — LISINOPRIL 20 MG PO TABS
20.0000 mg | ORAL_TABLET | Freq: Every day | ORAL | Status: DC
  Administered 2016-11-25 – 2016-11-28 (×4): 20 mg via ORAL
  Filled 2016-11-25 (×4): qty 1

## 2016-11-25 MED ORDER — PROPOFOL 10 MG/ML IV BOLUS
INTRAVENOUS | Status: DC | PRN
  Administered 2016-11-25: 150 mg via INTRAVENOUS

## 2016-11-25 MED ORDER — CHLORHEXIDINE GLUCONATE 4 % EX LIQD: 60.0000 mL | Freq: Once | CUTANEOUS | Status: DC

## 2016-11-25 MED ORDER — ONDANSETRON HCL 4 MG/2ML IJ SOLN: 4.0000 mg | Freq: Four times a day (QID) | INTRAMUSCULAR | Status: DC | PRN

## 2016-11-25 MED ORDER — OXYCODONE-ACETAMINOPHEN 5-325 MG PO TABS
1.0000 | ORAL_TABLET | Freq: Four times a day (QID) | ORAL | Status: DC | PRN
Start: 2016-11-25 — End: 2016-11-28
  Administered 2016-11-25 – 2016-11-28 (×8): 1 via ORAL
  Filled 2016-11-25 (×8): qty 1

## 2016-11-25 MED ORDER — POLYETHYLENE GLYCOL 3350 17 G PO PACK: 17.0000 g | PACK | Freq: Every day | ORAL | Status: DC | PRN

## 2016-11-25 MED ORDER — MIDAZOLAM HCL 2 MG/2ML IJ SOLN
INTRAMUSCULAR | Status: AC
  Filled 2016-11-25: qty 2

## 2016-11-25 MED ORDER — ONDANSETRON HCL 4 MG/2ML IJ SOLN
INTRAMUSCULAR | Status: DC | PRN
  Administered 2016-11-25: 4 mg via INTRAVENOUS

## 2016-11-25 MED ORDER — PHENOL 1.4 % MT LIQD: 1.0000 | OROMUCOSAL | Status: DC | PRN

## 2016-11-25 MED ORDER — ROPIVACAINE HCL 5 MG/ML IJ SOLN
INTRAMUSCULAR | Status: DC | PRN
  Administered 2016-11-25: 30 mL via PERINEURAL
  Administered 2016-11-25: 20 mL via PERINEURAL

## 2016-11-25 MED ORDER — CLINDAMYCIN PHOSPHATE 900 MG/50ML IV SOLN
900.0000 mg | INTRAVENOUS | Status: AC
  Administered 2016-11-25: 900 mg via INTRAVENOUS
  Filled 2016-11-25: qty 50

## 2016-11-25 MED ORDER — PROMETHAZINE HCL 25 MG/ML IJ SOLN: 6.2500 mg | INTRAMUSCULAR | Status: DC | PRN

## 2016-11-25 MED ORDER — MIDAZOLAM HCL 2 MG/2ML IJ SOLN
INTRAMUSCULAR | Status: DC | PRN
Start: 2016-11-25 — End: 2016-11-25
  Administered 2016-11-25 (×2): 1 mg via INTRAVENOUS

## 2016-11-25 MED ORDER — GLYCOPYRROLATE 0.2 MG/ML IJ SOLN
INTRAMUSCULAR | Status: DC | PRN
  Administered 2016-11-25: 0.2 mg via INTRAVENOUS

## 2016-11-25 SURGICAL SUPPLY — 32 items
BLADE SAW RECIP 87.9 MT (BLADE) ×3 IMPLANT
BLADE SURG 21 STRL SS (BLADE) ×3 IMPLANT
BNDG COHESIVE 6X5 TAN STRL LF (GAUZE/BANDAGES/DRESSINGS) ×6 IMPLANT
BNDG GAUZE ELAST 4 BULKY (GAUZE/BANDAGES/DRESSINGS) ×6 IMPLANT
CANISTER WOUND CARE 500ML ATS (WOUND CARE) ×3 IMPLANT
COVER SURGICAL LIGHT HANDLE (MISCELLANEOUS) ×3 IMPLANT
CUFF TOURNIQUET SINGLE 34IN LL (TOURNIQUET CUFF) IMPLANT
CUFF TOURNIQUET SINGLE 44IN (TOURNIQUET CUFF) IMPLANT
DRAPE INCISE IOBAN 66X45 STRL (DRAPES) IMPLANT
DRAPE U-SHAPE 47X51 STRL (DRAPES) ×3 IMPLANT
DRSG VAC ATS MED SENSATRAC (GAUZE/BANDAGES/DRESSINGS) ×3 IMPLANT
ELECT REM PT RETURN 9FT ADLT (ELECTROSURGICAL) ×3
ELECTRODE REM PT RTRN 9FT ADLT (ELECTROSURGICAL) ×1 IMPLANT
GLOVE BIOGEL PI IND STRL 9 (GLOVE) ×1 IMPLANT
GLOVE BIOGEL PI INDICATOR 9 (GLOVE) ×2
GLOVE SURG ORTHO 9.0 STRL STRW (GLOVE) ×3 IMPLANT
GOWN STRL REUS W/ TWL XL LVL3 (GOWN DISPOSABLE) ×2 IMPLANT
GOWN STRL REUS W/TWL XL LVL3 (GOWN DISPOSABLE) ×4
KIT BASIN OR (CUSTOM PROCEDURE TRAY) ×3 IMPLANT
KIT ROOM TURNOVER OR (KITS) ×3 IMPLANT
MANIFOLD NEPTUNE II (INSTRUMENTS) ×3 IMPLANT
NS IRRIG 1000ML POUR BTL (IV SOLUTION) ×3 IMPLANT
PACK ORTHO EXTREMITY (CUSTOM PROCEDURE TRAY) ×3 IMPLANT
PAD ARMBOARD 7.5X6 YLW CONV (MISCELLANEOUS) ×3 IMPLANT
PREVENA INCISION MGT 90 150 (MISCELLANEOUS) ×3 IMPLANT
SPONGE LAP 18X18 X RAY DECT (DISPOSABLE) IMPLANT
STAPLER VISISTAT 35W (STAPLE) IMPLANT
STOCKINETTE IMPERVIOUS LG (DRAPES) ×3 IMPLANT
SUT SILK 2 0 (SUTURE) ×2
SUT SILK 2-0 18XBRD TIE 12 (SUTURE) ×1 IMPLANT
SUT VIC AB 1 CTX 27 (SUTURE) IMPLANT
TOWEL OR 17X26 10 PK STRL BLUE (TOWEL DISPOSABLE) ×3 IMPLANT

## 2016-11-25 NOTE — Anesthesia Postprocedure Evaluation (Signed)
Anesthesia Post Note  Patient: Tanner CoffinWilliam M Thal  Procedure(s) Performed: Procedure(s) (LRB): Left Below Knee Amputation (Left)  Patient location during evaluation: PACU Anesthesia Type: Regional Level of consciousness: awake and alert Pain management: pain level controlled Vital Signs Assessment: post-procedure vital signs reviewed and stable Respiratory status: spontaneous breathing, nonlabored ventilation and respiratory function stable Cardiovascular status: blood pressure returned to baseline and stable Postop Assessment: no signs of nausea or vomiting Anesthetic complications: no       Last Vitals:  Vitals:   11/25/16 1241 11/25/16 1256  BP: (!) 145/112 (!) 146/84  Pulse: 84 65  Resp: 16 11  Temp: 36.3 C     Last Pain:  Vitals:   11/25/16 1241  TempSrc:   PainSc: 0-No pain                 Lowella CurbWarren Ray Akari Defelice

## 2016-11-25 NOTE — Progress Notes (Signed)
Call received from lab to report positive MRSA screen. [facility].

## 2016-11-25 NOTE — Anesthesia Preprocedure Evaluation (Addendum)
Anesthesia Evaluation  Patient identified by MRN, date of birth, ID band Patient awake    Reviewed: Allergy & Precautions, H&P , NPO status , Patient's Chart, lab work & pertinent test results  Airway Mallampati: II   Neck ROM: full    Dental   Pulmonary asthma , former smoker,    breath sounds clear to auscultation       Cardiovascular hypertension,  Rhythm:regular Rate:Normal     Neuro/Psych    GI/Hepatic GERD  ,(+) Hepatitis -, C  Endo/Other  diabetes, Type 2  Renal/GU      Musculoskeletal  (+) Arthritis ,   Abdominal   Peds  Hematology   Anesthesia Other Findings   Reproductive/Obstetrics                             Anesthesia Physical  Anesthesia Plan  ASA: III  Anesthesia Plan: General and Regional   Post-op Pain Management: GA combined w/ Regional for post-op pain   Induction: Intravenous  Airway Management Planned: LMA  Additional Equipment:   Intra-op Plan:   Post-operative Plan:   Informed Consent: I have reviewed the patients History and Physical, chart, labs and discussed the procedure including the risks, benefits and alternatives for the proposed anesthesia with the patient or authorized representative who has indicated his/her understanding and acceptance.     Plan Discussed with: CRNA, Anesthesiologist and Surgeon  Anesthesia Plan Comments:        Anesthesia Quick Evaluation

## 2016-11-25 NOTE — Anesthesia Procedure Notes (Signed)
Procedure Name: LMA Insertion Date/Time: 11/25/2016 12:07 PM Performed by: Anitra LauthMILLER, WARREN RAY Pre-anesthesia Checklist: Emergency Drugs available, Patient identified, Suction available and Patient being monitored Patient Re-evaluated:Patient Re-evaluated prior to inductionOxygen Delivery Method: Circle system utilized Preoxygenation: Pre-oxygenation with 100% oxygen Intubation Type: IV induction Ventilation: Mask ventilation without difficulty LMA: LMA inserted LMA Size: 5.0 Tube type: Oral Number of attempts: 1 Placement Confirmation: CO2 detector and breath sounds checked- equal and bilateral Tube secured with: Tape Dental Injury: Teeth and Oropharynx as per pre-operative assessment

## 2016-11-25 NOTE — Op Note (Signed)
   Date of Surgery: 11/25/2016  INDICATIONS: Mr. Tanner Santana is a 66 y.o.-year-old male who has undergone foot salvage intervention on the left he presents with recurrent osteomyelitis of the forefoot and elects to proceed with a transtibial amputation.Marland Kitchen.  PREOPERATIVE DIAGNOSIS: Osteomyelitis left forefoot status post partial foot amputations  POSTOPERATIVE DIAGNOSIS: Same.  PROCEDURE: Transtibial amputation Application of Prevena wound VAC Topical TXA 2 gms Stump shrinker  SURGEON: Lajoyce Cornersuda, M.D.  ANESTHESIA:  general  IV FLUIDS AND URINE: See anesthesia.  ESTIMATED BLOOD LOSS: min mL.  COMPLICATIONS: None.  DESCRIPTION OF PROCEDURE: The patient was brought to the operating room and underwent a general anesthetic. After adequate levels of anesthesia were obtained patient's lower extremity was prepped using DuraPrep draped into a sterile field. A timeout was called. The foot was draped out of the sterile field with impervious stockinette. A transverse incision was made 11 cm distal to the tibial tubercle. This curved proximally and a large posterior flap was created. The tibia was transected 1 cm proximal to the skin incision. The fibula was transected just proximal to the tibial incision. The tibia was beveled anteriorly. A large posterior flap was created. The sciatic nerve was pulled cut and allowed to retract. The vascular bundles were suture ligated with 2-0 silk. The deep and superficial fascial layers were closed using #1 Vicryl. The skin was closed using staples and 2-0 nylon. The wound was covered with a Prevena wound VAC. There was a good suction fit. A prosthetic shrinker was applied. Patient was extubated taken to the PACU in stable condition.  Tanner BakerMarcus Isom Kochan, MD [facility] 12:36 PM

## 2016-11-25 NOTE — H&P (Signed)
Tanner Santana is an 66 y.o. male.   Chief Complaint: Infected left foot failed foot salvage intervention. HPI: Tanner Santana is a 66 year old gentleman who is undergone foot salvage intervention. Now has osteomyelitis of the metatarsal heads. Discussed treatment options including a transmetatarsal amputation versus a transtibial amputation. Tanner Santana states he does not want to continuing have to worry about additional surgeries and wishes to proceed with a transtibial amputation  Past Medical History:  Diagnosis Date  . Arthritis   . Asthma   . Constipation   . GERD (gastroesophageal reflux disease)   . Hemorrhoids   . Hepatitis C    treated in 2014  . History of glaucoma    resolved with cataract surgery  . History of kidney stones    passed  . History of MRSA infection    requiring amputation  . Hypertension   . Neuropathy    Hands Feet  . PTSD (post-traumatic stress disorder)     Past Surgical History:  Procedure Laterality Date  . APPENDECTOMY  12/70  . BACK SURGERY    . CATARACT EXTRACTION  2/12, 4/12  . COLONOSCOPY  04/2015  . EYE SURGERY Bilateral    with lens  . FOOT ARTHRODESIS Left 12/18/2015   Procedure: Left Great Toe Metatarsal , Cunieform Fusion, Lisfranc Fusion;  Surgeon: Tanner Mustard, MD;  Location: [facility];  Service: Orthopedics;  Laterality: Left;  . LUMBAR LAMINECTOMY/DECOMPRESSION MICRODISCECTOMY  06/26/2012   Procedure: LUMBAR LAMINECTOMY/DECOMPRESSION MICRODISCECTOMY 1 LEVEL;  Surgeon: Barnett Abu, MD;  Location: [facility];  Service: Neurosurgery;  Laterality: Left;  Left Lumbar four-five Laminectomy/foraminotomy/microscope  . NASAL SINUS SURGERY     4 times  . ORIF ANKLE FRACTURE Right 05/27/2016   Procedure: OPEN REDUCTION INTERNAL FIXATION (ORIF) LISFRANC AND BASE 1ST METATARSAL, WITH PROXIMAL INTERPHALANGEAL RESECTION 4TH TOE RIGHT FOOT;  Surgeon: Tanner Mustard, MD;  Location: [facility];  Service: Orthopedics;  Laterality: Right;  . TOE AMPUTATION  03/15/12,  05/14/12   2nd toe left foot, and 5th toe right foot  . TRANSURETHRAL RESECTION OF PROSTATE    . UPPER GASTROINTESTINAL ENDOSCOPY    . WEIL OSTEOTOMY Left 09/14/2016   Procedure: WEIL OSTEOTOMY LEFT FOOT 4TH METATARSAL;  Surgeon: Tanner Mustard, MD;  Location: [facility];  Service: Orthopedics;  Laterality: Left;    No family history on file. Social History:  reports that he quit smoking about 22 years ago. He has a 42.00 pack-year smoking history. He has never used smokeless tobacco. He reports that he drinks about 0.6 oz of alcohol per week . He reports that he does not use drugs.  Allergies:  Allergies  Allergen Reactions  . Augmentin [Amoxicillin-Pot Clavulanate] Diarrhea and Other (See Comments)    COLITIS > SEVERE DIARRHEA  Has Tanner Santana had a PCN reaction causing immediate rash, facial/tongue/throat swelling, SOB or lightheadedness with hypotension: no Has Tanner Santana had a PCN reaction causing severe rash involving mucus membranes or skin necrosis: no Has Tanner Santana had a PCN reaction that required hospitalization: #  #  #  YES  #  #  # > EMERGENCY ROOM Has Tanner Santana had a PCN reaction occurring within the last 10 years: #  #  #  YES  #  #  #  If all of the above answers are "NO", then may proceed w  . Ceftin [Cefuroxime Axetil] Diarrhea and Other (See Comments)    COLITIS > SEVERE DIARRHEA    No prescriptions prior to admission.    No  results found for this or any previous visit (from the past 48 hour(s)). No results found.  ROS  There were no vitals taken for this visit. Physical Exam examination Tanner Santana is alert oriented Neuropathy well-dressed normal affect or so after he has an antalgic gait is ulceration that probes to bone over the metatarsal heads he has radiographic findings consistent with osteomyelitis.  Assessment/Plan Assessment: Osteomyelitis left forefoot.  Plan: Plan for a left transtibial amputation. Risk and benefits were discussed including risk of the wound not  healing Tanner Santana states he understands wish to proceed at this time.  Tanner MustardMarcus V Duda, MD 11/25/2016, 7:09 AM

## 2016-11-25 NOTE — Anesthesia Procedure Notes (Signed)
Anesthesia Regional Block: Popliteal block   Pre-Anesthetic Checklist: ,, timeout performed, Correct Patient, Correct Site, Correct Laterality, Correct Procedure, Correct Position, site marked, Risks and benefits discussed,  Surgical consent,  Pre-op evaluation,  At surgeon's request and post-op pain management  Laterality: Left  Prep: Dura Prep       Needles:  Injection technique: Single-shot  Needle Type: Stimiplex     Needle Length: 9cm  Needle Gauge: 21     Additional Needles:   Procedures: ultrasound guided,,,,,,,,  Narrative:  Start time: 11/25/2016 11:00 AM End time: 11/25/2016 11:03 AM Injection made incrementally with aspirations every 5 mL.  Performed by: Personally  Anesthesiologist: Anitra LauthMILLER, WARREN RAY

## 2016-11-25 NOTE — Transfer of Care (Signed)
Immediate Anesthesia Transfer of Care Note  Patient: Tanner Santana  Procedure(s) Performed: Procedure(s): Left Below Knee Amputation (Left)  Patient Location: PACU  Anesthesia Type:General and Regional  Level of Consciousness: awake, alert  and patient cooperative  Airway & Oxygen Therapy: Patient Spontanous Breathing and Patient connected to face mask oxygen  Post-op Assessment: Report given to RN, Post -op Vital signs reviewed and stable, Patient moving all extremities X 4 and Patient able to stick tongue midline  Post vital signs: Reviewed and stable  Last Vitals:  Vitals:   11/25/16 1032 11/25/16 1241  BP: 126/73   Pulse: 60   Resp: 20   Temp: 36.8 C (P) 36.3 C    Last Pain:  Vitals:   11/25/16 1241  TempSrc:   PainSc: (P) 0-No pain      Patients Stated Pain Goal: 5 (11/25/16 1013)  Complications: No apparent anesthesia complications

## 2016-11-25 NOTE — Anesthesia Procedure Notes (Signed)
Anesthesia Regional Block: Femoral nerve block   Pre-Anesthetic Checklist: ,, timeout performed, Correct Patient, Correct Site, Correct Laterality, Correct Procedure, Correct Position, site marked, Risks and benefits discussed,  Surgical consent,  Pre-op evaluation,  At surgeon's request and post-op pain management  Laterality: Left  Prep: Dura Prep       Needles:  Injection technique: Single-shot  Needle Type: Stimiplex     Needle Length: 9cm  Needle Gauge: 21     Additional Needles:   Procedures: ultrasound guided,,,,,,,,   Nerve Stimulator or Paresthesia:  Response: quadraceps contraction, 0.45 mA,   Additional Responses:   Narrative:  Injection made incrementally with aspirations every 5 mL.  Performed by: Personally  Anesthesiologist: Anitra LauthMILLER, Boykin Baetz RAY  Additional Notes:

## 2016-11-26 ENCOUNTER — Encounter (HOSPITAL_COMMUNITY): Payer: Self-pay | Admitting: Orthopedic Surgery

## 2016-11-26 NOTE — Evaluation (Signed)
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 PMH significant for MRSA infection, neuropathy, and HTN.  Clinical Impression  Patient is s/p above surgery resulting in functional limitations due to the deficits listed below (see PT Problem List). Pt is mod I for bed mobility, min guard for transfers and minAx1 for ambulation of 10 feet with RW. Patient will benefit from skilled PT to increase their independence and safety with mobility to allow discharge to the venue listed below.       Follow Up Recommendations Home health PT;Supervision/Assistance - 24 hour    Equipment Recommendations  Other (comment) (pt has what he needs)    Recommendations for Other Services OT consult     Precautions / Restrictions Precautions Precautions: Fall Precaution Comments: neuropathy in R foot complete sensation loss from ball through toes  Restrictions Weight Bearing Restrictions: Yes LLE Weight Bearing: Non weight bearing      Mobility  Bed Mobility Overal bed mobility: Needs Assistance Bed Mobility: Supine to Sit     Supine to sit: Modified independent (Device/Increase time);HOB elevated     General bed mobility comments: Pt able to bring himself EoB with HoB elevated and utilizing handrail with no vc  Transfers Overall transfer level: Needs assistance Equipment used: Rolling walker (2 wheeled) Transfers: Sit to/from Stand Sit to Stand: Min guard         General transfer comment: min guard for safety, good power up and steadying at upright  Ambulation/Gait Ambulation/Gait assistance: Min assist Ambulation Distance (Feet): 10 Feet Assistive device: Rolling walker (2 wheeled) Gait Pattern/deviations:  (hop to pattern) Gait velocity: slowed Gait velocity interpretation:  Below normal speed for age/gender General Gait Details: minA for steadying, good use of RW to pivot and turn       Balance Overall balance assessment: Needs assistance Sitting-balance support: No upper extremity supported;Feet unsupported Sitting balance-Leahy Scale: Good Sitting balance - Comments: able to sit EoB with no LoB   Standing balance support: Bilateral upper extremity supported Standing balance-Leahy Scale: Fair Standing balance comment: pt requires RW for standing balance                             Pertinent Vitals/Pain Pain Assessment: 0-10 Pain Score: 7  Pain Location: phantom L foot pain, R foot neuropathy Pain Descriptors / Indicators: Shooting Pain Intervention(s): Premedicated before session;Limited activity within patient's tolerance;Monitored during session  VSS    Home Living Family/patient expects to be discharged to:: Private residence Living Arrangements: Spouse/significant other Available Help at Discharge: Friend(s);Available 24 hours/day Type of Home: House Home Access: Stairs to enter Entrance Stairs-Rails: Left Entrance Stairs-Number of Steps: 3 Home Layout: Two level;Able to live on main level with bedroom/bathroom Home Equipment: Crutches;Walker - 2 wheels;Wheelchair - Engineer, technical salesmanual;Wheelchair - power;Cane - quad;Shower seat;Grab bars - tub/shower      Prior Function Level of Independence: Independent with assistive device(s)         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, driver        Extremity/Trunk Assessment   Upper Extremity Assessment Upper Extremity Assessment: Overall WFL for tasks assessed    Lower Extremity Assessment Lower Extremity Assessment: RLE deficits/detail;LLE deficits/detail RLE Deficits / Details: R LE ROM and strength WFL RLE Sensation: history of peripheral  neuropathy (no sensation from ball of foot to toes, decreased to knee ) LLE Deficits / Details: L hip and knee ROM and strength WFL    Cervical / Trunk  Assessment Cervical / Trunk Assessment: Normal  Communication   Communication: No difficulties  Cognition Arousal/Alertness: Awake/alert Behavior During Therapy: WFL for tasks assessed/performed Overall Cognitive Status: Within Functional Limits for tasks assessed                                        General Comments General comments (skin integrity, edema, etc.): Pt educated on need to maintain knee ROM for eventual prosthetic placement and to not put pillows under his knee to maintain extension    Exercises Total Joint Exercises Quad Sets: AROM;Left;10 reps;Seated Hip ABduction/ADduction: AROM;Left;Seated;10 reps Straight Leg Raises: AROM;Left;10 reps Knee Flexion: AROM;Left;10 reps;Seated   Assessment/Plan    PT Assessment Patient needs continued PT services  PT Problem List Decreased balance;Decreased mobility;Decreased safety awareness;Decreased activity tolerance;Impaired sensation;Pain       PT Treatment Interventions DME instruction;Gait training;Stair training;Functional mobility training;Therapeutic activities;Therapeutic exercise;Balance training;Patient/family education    PT Goals (Current goals can be found in the Care Plan section)  Acute Rehab PT Goals Patient Stated Goal: to go home PT Goal Formulation: With patient Time For Goal Achievement: 12/10/16 Potential to Achieve Goals: Good    Frequency Min 2X/week    AM-PAC PT "6 Clicks" Daily Activity  Outcome Measure Difficulty turning over in bed (including adjusting bedclothes, sheets and blankets)?: A Little Difficulty moving from lying on back to sitting on the side of the bed? : A Little Difficulty sitting down on and standing up from a chair with arms (e.g., wheelchair, bedside commode, etc,.)?: A Little Help needed moving to and from a bed to chair (including a wheelchair)?: A Little Help needed walking in hospital room?: A Lot Help needed climbing 3-5 steps with a railing? :  Total 6 Click Score: 15    End of Session Equipment Utilized During Treatment: Gait belt Activity Tolerance: Patient tolerated treatment well Patient left: in chair;with call bell/phone within reach Nurse Communication: Mobility status;Weight bearing status;Other (comment) (pt request toothbrush) PT Visit Diagnosis: Unsteadiness on feet (R26.81);Other abnormalities of gait and mobility (R26.89);Pain;Other symptoms and signs involving the nervous system (R29.898) Pain - Right/Left: Left    Time: 1610-9604 PT Time Calculation (min) (ACUTE ONLY): 29 min   Charges:   PT Evaluation $PT Eval Low Complexity: 1 Procedure PT Treatments $Gait Training: 8-22 mins   PT G Codes:        Tanner Santana B. Beverely Risen PT, DPT Acute Rehabilitation  [phone] Pager [phone]    Elon Alas Fleet 11/26/2016, 10:18 AM

## 2016-11-26 NOTE — Progress Notes (Signed)
Patient's pain is well controlled.  Did well with PT.  Plan is for dc monday

## 2016-11-28 MED ORDER — OXYCODONE-ACETAMINOPHEN 5-325 MG PO TABS: 1.0000 | ORAL_TABLET | ORAL | 0 refills | Status: AC | PRN

## 2016-11-28 MED ORDER — METHOCARBAMOL 500 MG PO TABS: 500.0000 mg | ORAL_TABLET | Freq: Three times a day (TID) | ORAL | 0 refills | Status: AC

## 2016-11-28 NOTE — Care Management Note (Signed)
Case Management Note  Patient Details  Name: Janice CoffinWilliam M Bezanson MRN: [id number] Date of Birth: 14-Apr-1951  Subjective/Objective:                 Patient states he has RW WC standard and electric at home. Provena VAC at bedside, to be switched out upon DC. [facility] and be removed by surgeon at office. Patient states he lives at home with wife who will be able to assist him with ADLs as needed. Patient declines HH PT. No other CM needs identified at this time.    Action/Plan:  DC to home w Provena VAC.  Expected Discharge Date:  11/28/16               Expected Discharge Plan:  Home/Self Care  In-House Referral:     Discharge planning Services  CM Consult  Post Acute Care Choice:    Choice offered to:     DME Arranged:    DME Agency:     HH Arranged:    HH Agency:     Status of Service:  Completed, signed off  If discussed at [employer] Length of Stay Meetings, dates discussed:    Additional Comments:  Lawerance SabalDebbie Adelyne Marchese, RN 11/28/2016, 9:46 AM

## 2016-11-28 NOTE — Discharge Summary (Signed)
Discharge Diagnoses:  Active Problems:   Below knee amputation status, left [facility])   Surgeries: Procedure(s): Left Below Knee Amputation on 11/25/2016    Consultants:   Discharged Condition: Improved  Hospital Course: Tanner Santana is an 66 y.o. male who was admitted 11/25/2016 with a chief complaint of osteomyelitis left forefoot, with a final diagnosis of Osteomyelitis Left Foot 4th Toe.  Patient was brought to the operating room on 11/25/2016 and underwent Procedure(s): Left Below Knee Amputation.    Patient was given perioperative antibiotics: Anti-infectives    Start     Dose/Rate Route Frequency Ordered Stop   11/25/16 1800  clindamycin (CLEOCIN) IVPB 600 mg     600 mg 100 mL/hr over 30 Minutes Intravenous Every 6 hours 11/25/16 1325 11/26/16 0657   11/25/16 0930  clindamycin (CLEOCIN) IVPB 900 mg     900 mg 100 mL/hr over 30 Minutes Intravenous On call to O.R. 11/25/16 1610 11/25/16 1207    .  Patient was given sequential compression devices, early ambulation, and aspirin for DVT prophylaxis.  Recent vital signs: Patient Vitals for the past 24 hrs:  BP Temp Temp src Pulse Resp SpO2  11/28/16 0606 (!) 145/84 98.7 F (37.1 C) Oral 83 16 98 %  11/27/16 1957 134/83 99 F (37.2 C) Oral 77 18 99 %  11/27/16 1734 (!) 177/63 98.6 F (37 C) Oral 79 18 96 %  11/27/16 1555 (!) 141/82 98.5 F (36.9 C) Oral 77 18 99 %  11/27/16 0805 - - - 75 18 97 %  .  Recent laboratory studies: No results found.  Discharge Medications:   Allergies as of 11/28/2016      Reactions   Augmentin [amoxicillin-pot Clavulanate] Diarrhea, Other (See Comments)   COLITIS > SEVERE DIARRHEA Has patient had a PCN reaction causing immediate rash, facial/tongue/throat swelling, SOB or lightheadedness with hypotension: no Has patient had a PCN reaction causing severe rash involving mucus membranes or skin necrosis: no Has patient had a PCN reaction that required hospitalization: #  #  #  YES  #  #  # >  EMERGENCY ROOM Has patient had a PCN reaction occurring within the last 10 years: #  #  #  YES  #  #  #  If all of the above answers are "NO", then may proceed w   Ceftin [cefuroxime Axetil] Diarrhea, Other (See Comments)   COLITIS > SEVERE DIARRHEA      Medication List    TAKE these medications   albuterol 108 (90 Base) MCG/ACT inhaler Commonly known as:  PROVENTIL HFA;VENTOLIN HFA Inhale 1-2 puffs into the lungs every 6 (six) hours as needed for wheezing or shortness of breath.   esomeprazole 20 MG capsule Commonly known as:  NEXIUM Take 20 mg by mouth daily.   fluticasone 50 MCG/ACT nasal spray Commonly known as:  FLONASE Place 2 sprays into both nostrils at bedtime.   gi cocktail Susp suspension Take 15-30 mLs by mouth 2 (two) times daily as needed (for acid reflux).   lisinopril 20 MG tablet Commonly known as:  PRINIVIL,ZESTRIL Take 20 mg by mouth daily.   methocarbamol 500 MG tablet Commonly known as:  ROBAXIN Take 1 tablet (500 mg total) by mouth 3 (three) times daily.   mometasone 220 MCG/INH inhaler Commonly known as:  ASMANEX Inhale 1 puff into the lungs at bedtime.   morphine 100 MG 12 hr tablet Commonly known as:  MS CONTIN Take 100 mg by mouth every 12 (twelve)  hours.   oxyCODONE-acetaminophen 5-325 MG tablet Commonly known as:  ROXICET Take 1 tablet by mouth every 4 (four) hours as needed for severe pain. What changed:  You were already taking a medication with the same name, and this prescription was added. Make sure you understand how and when to take each.   oxyCODONE-acetaminophen 10-325 MG tablet Commonly known as:  PERCOCET Take 1 tablet by mouth every 6 (six) hours as needed (for pain.). For pain What changed:  Another medication with the same name was added. Make sure you understand how and when to take each.   polyethylene glycol packet Commonly known as:  MIRALAX / GLYCOLAX Take 17 g by mouth daily as needed (constipation).   predniSONE 5  MG tablet Commonly known as:  DELTASONE Take 5 mg by mouth daily.   Vitamin D3 5000 units Tabs Take 5,000 Units by mouth daily.       Diagnostic Studies: Xr Foot Complete Left  Result Date: 11/17/2016 Radiographs show osteomyelitis involving the fourth toe. He has a very irregular metatarsal head pattern secondary to partial amputations.   Patient benefited maximally from their hospital stay and there were no complications.     Disposition: 01-Home or Self Care Discharge Instructions    Call MD / Call 911    Complete by:  As directed    If you experience chest pain or shortness of breath, CALL 911 and be transported to the hospital emergency room.  If you develope a fever above 101 F, pus (white drainage) or increased drainage or redness at the wound, or calf pain, call your surgeon's office.   Constipation Prevention    Complete by:  As directed    Drink plenty of fluids.  Prune juice may be helpful.  You may use a stool softener, such as Colace (over the counter) 100 mg twice a day.  Use MiraLax (over the counter) for constipation as needed.   Diet - low sodium heart healthy    Complete by:  As directed    Increase activity slowly as tolerated    Complete by:  As directed    Negative Pressure Wound Therapy - Incisional    Complete by:  As directed    Attached wound VAC to the portable Prevena pump. When the pump stops working patient is to remove the dressing sponge and reapply the stump shrinker.     Follow-up Information    Tanner Santana, Tanner Leedom V, MD Follow up in 1 week(s).   Specialty:  Orthopedic Surgery Contact information: [address] [place] [address] [phone]            Signed: Nadara MustardMarcus Santana Babs Santana 11/28/2016, 7:02 AM

## 2016-11-28 NOTE — Care Management Important Message (Signed)
Important Message  Patient Details  Name: Janice CoffinWilliam M Racanelli MRN: [id number] Date of Birth: [birth year]   Medicare Important Message Given:  Yes    Lawerance Sabalebbie Arlis Yale, RN 11/28/2016, 10:14 AM

## 2016-11-28 NOTE — Progress Notes (Signed)
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 PMH significant for MRSA infection, neuropathy, and HTN.    PT Comments    Pt is making good progress towards his goals. Pt currently mod I for bed mobility, min guard for transfers to RW, ambulation of 5 feet and ascent/descent of 10 steps. Pt educated on HEP and given handout. Pt requires skilled PT to continue to progress ambulation and to improve LE strength and endurance.    Follow Up Recommendations  Home health PT;Supervision/Assistance - 24 hour     Equipment Recommendations  Other (comment) (pt has what he needs)    Recommendations for Other Services OT consult     Precautions / Restrictions Precautions Precautions: Fall Precaution Comments: neuropathy in R foot complete sensation loss from ball through toes  Restrictions Weight Bearing Restrictions: Yes LLE Weight Bearing: Non weight bearing    Mobility  Bed Mobility Overal bed mobility: Needs Assistance Bed Mobility: Supine to Sit     Supine to sit: Modified independent (Device/Increase time);HOB elevated     General bed mobility comments: Pt able to bring himself EoB with HoB elevated and utilizing handrail with no vc  Transfers Overall transfer level: Needs assistance Equipment used: Rolling walker (2 wheeled) Transfers: Sit to/from Stand Sit to Stand: Min guard         General transfer comment: min guard for safety, good power up and steadying at upright  Ambulation/Gait Ambulation/Gait assistance: Min guard Ambulation Distance (Feet): 5 Feet Assistive device: Rolling walker (2 wheeled) Gait Pattern/deviations:  (hop to pattern) Gait velocity: slowed   General Gait Details: vc for hand placement and staying in RW   Stairs Stairs: Yes    Stair Management: One rail Left;Step to pattern;Sideways Number of Stairs: 10 General stair comments: vc for hand placement, and pushing through the railing      Balance Overall balance assessment: Needs assistance Sitting-balance support: No upper extremity supported;Feet unsupported Sitting balance-Leahy Scale: Good Sitting balance - Comments: able to sit EoB with no LoB   Standing balance support: During functional activity;No upper extremity supported Standing balance-Leahy Scale: Good Standing balance comment: Pt able to stand without support from RW after power up                            Cognition Arousal/Alertness: Awake/alert Behavior During Therapy: [facility] for tasks assessed/performed Overall Cognitive Status: Within Functional Limits for tasks assessed                                        Exercises Total Joint Exercises Quad Sets: AROM;Left;10 reps;Seated Hip ABduction/ADduction: AROM;Left;Seated;10 reps Straight Leg Raises: AROM;Left;10 reps Long Arc Quad: AROM;Left;10 reps;Seated Knee Flexion: AROM;Left;10 reps;Seated Amputee Exercises Towel Squeeze: AROM;10 reps;Seated Hip Flexion/Marching: AROM;Left;10 reps;Seated Knee Flexion: AROM;Left;10 reps;Seated Knee Extension: AROM;Left;10 reps;Seated Straight Leg Raises: AROM;Left;10 reps;Seated        Pertinent Vitals/Pain Pain Assessment: 0-10 Pain Score: 5  Pain Location: phantom L foot pain, R foot neuropathy Pain Descriptors / Indicators: Shooting Pain Intervention(s): Premedicated before session;Monitored during session;Limited activity within patient's tolerance  VSS  PT Goals (current goals can now be found in the care plan section) Acute Rehab PT Goals Patient Stated Goal: to go home PT Goal Formulation: With patient Time For Goal Achievement: 12/10/16 Potential to Achieve Goals: Good Progress towards PT goals: Progressing toward goals     Frequency    Min 2X/week      PT Plan Current plan remains appropriate       AM-PAC PT "6 Clicks" Daily Activity  Outcome Measure  Difficulty turning over in bed (including adjusting bedclothes, sheets and blankets)?: A Little Difficulty moving from lying on back to sitting on the side of the bed? : A Little Difficulty sitting down on and standing up from a chair with arms (e.g., wheelchair, bedside commode, etc,.)?: A Little Help needed moving to and from a bed to chair (including a wheelchair)?: A Little Help needed walking in hospital room?: A Lot Help needed climbing 3-5 steps with a railing? : Total 6 Click Score: 15    End of Session Equipment Utilized During Treatment: Gait belt Activity Tolerance: Patient tolerated treatment well Patient left: in chair;with call bell/phone within reach Nurse Communication: Mobility status;Weight bearing status;Other (comment) (pt request toothbrush) PT Visit Diagnosis: Unsteadiness on feet (R26.81);Other abnormalities of gait and mobility (R26.89);Pain;Other symptoms and signs involving the nervous system (R29.898) Pain - Right/Left: Left     Time: [id number] PT Time Calculation (min) (ACUTE ONLY): 30 min  Charges:  $Gait Training: 8-22 mins $Therapeutic Exercise: 8-22 mins                    G Codes:       Maclean Foister B. Beverely RisenVan Fleet PT, DPT Acute Rehabilitation  [phone] Pager [phone]     Elon Alaslizabeth B Van Fleet 11/28/2016, 2:02 PM

## 2016-12-05 ENCOUNTER — Inpatient Hospital Stay (INDEPENDENT_AMBULATORY_CARE_PROVIDER_SITE_OTHER): Payer: Medicare Other | Admitting: Orthopedic Surgery

## 2016-12-05 ENCOUNTER — Ambulatory Visit (INDEPENDENT_AMBULATORY_CARE_PROVIDER_SITE_OTHER): Payer: Medicare Other | Admitting: Orthopedic Surgery

## 2016-12-05 ENCOUNTER — Inpatient Hospital Stay (INDEPENDENT_AMBULATORY_CARE_PROVIDER_SITE_OTHER): Payer: Medicare Other | Admitting: Family

## 2016-12-05 ENCOUNTER — Encounter (INDEPENDENT_AMBULATORY_CARE_PROVIDER_SITE_OTHER): Payer: Self-pay | Admitting: Orthopedic Surgery

## 2016-12-05 VITALS — Ht 76.0 in | Wt 185.0 lb

## 2016-12-05 DIAGNOSIS — Z89512 Acquired absence of left leg below knee: Secondary | ICD-10-CM

## 2016-12-05 NOTE — Progress Notes (Signed)
Office Visit Note   Patient: Tanner CoffinWilliam M Santana           Date of Birth: 1951/01/26           MRN: [id number] Visit Date: 12/05/2016              Requested by: Sunnie NielsenMorgan, Gary Lon, MD [address] [place], [place] [address] PCP: Sunnie NielsenMorgan, Gary Lon, MD  Chief Complaint  Patient presents with  . Left Leg - Routine Post Op    11/25/16 Lt BKA 10 days post op      HPI: Patient is one-week status post left transtibial amputation. He denies any pain or symptoms. The wound VAC was functioning for the entire week postoperatively and he was wearing a stump shrinker.  Assessment & Plan: Visit Diagnoses:  1. Acquired absence of left leg below knee (HCC)     Plan: Plan to continue with a stump shrinker begin washing his leg with soap and water. [facility] [place] [facility] [place] and he was given [place] [facility] [place] [facility] [place] [facility] [place]. Patient will be a good K3 level ambulator he needs to walk at varying gait needs to [place] the walk up and down on bleachers and on uneven terrain.  Follow-Up Instructions: Return in about 1 week (around 12/12/2016).   Ortho Exam  Patient is alert, oriented, no adenopathy, well-dressed, normal affect, normal respiratory effort. Examination the incision is healing quite nicely there is no redness no cellulitis no odor no drainage no signs of infection patient is excellent consolidation of the residual limb with wearing compression and the negative wound VAC.  Imaging: No results found.  Labs: No results found for: HGBA1C, ESRSEDRATE, CRP, LABURIC, REPTSTATUS, GRAMSTAIN, CULT, LABORGA  Orders:  No orders of the defined types were placed in this encounter.  No orders of the defined types were placed in this encounter.    Procedures: No procedures performed  Clinical Data: No additional findings.  ROS:  All other systems negative, except as noted in the HPI. Review of Systems  Objective: Vital  Signs: Ht 6\' 4"  (1.93 m)   Wt 185 lb (83.9 kg)   BMI 22.52 kg/m   Specialty Comments:  No specialty comments available.  PMFS History: Patient Active Problem List   Diagnosis Date Noted  . Acquired absence of left leg below knee (HCC) 12/05/2016  . Below knee amputation status, left (HCC) 11/25/2016  . Subacute osteomyelitis, left ankle and foot (HCC) 11/17/2016  . Pain in right ankle and joints of right foot 11/03/2016  . Non-pressure chronic ulcer of other part of left foot limited to breakdown of skin (HCC) 09/05/2016  . Ulcer of left foot, limited to breakdown of skin (HCC) 07/22/2016  . Charcot's joint of right foot 06/13/2016  . 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 Acquired claw toe, left    Past Medical History:  Diagnosis Date  . Arthritis   . Asthma   . Constipation   . GERD (gastroesophageal reflux disease)   . Hemorrhoids   . Hepatitis C    treated in 2014  . History of glaucoma    resolved with cataract surgery  . History of kidney stones    passed  . History of MRSA infection    requiring amputation  . Hypertension   . Neuropathy    Hands Feet  . PTSD (post-traumatic stress disorder)  History reviewed. No pertinent family history.  Past Surgical History:  Procedure Laterality Date  . AMPUTATION Left 11/25/2016   Procedure: Left Below Knee Amputation;  Surgeon: Nadara Mustard, MD;  Location: [facility] OR;  Service: Orthopedics;  Laterality: Left;  . APPENDECTOMY  12/70  . BACK SURGERY    . CATARACT EXTRACTION  2/12, 4/12  . COLONOSCOPY  04/2015  . EYE SURGERY Bilateral    with lens  . FOOT ARTHRODESIS Left 12/18/2015   Procedure: Left Great Toe Metatarsal , Cunieform Fusion, Lisfranc Fusion;  Surgeon: Nadara Mustard, MD;  Location: [facility] OR;  Service: Orthopedics;  Laterality: Left;  . LUMBAR LAMINECTOMY/DECOMPRESSION MICRODISCECTOMY  06/26/2012   Procedure: LUMBAR LAMINECTOMY/DECOMPRESSION MICRODISCECTOMY 1 LEVEL;  Surgeon:  Barnett Abu, MD;  Location: [facility];  Service: Neurosurgery;  Laterality: Left;  Left Lumbar four-five Laminectomy/foraminotomy/microscope  . NASAL SINUS SURGERY     4 times  . ORIF ANKLE FRACTURE Right 05/27/2016   Procedure: OPEN REDUCTION INTERNAL FIXATION (ORIF) LISFRANC AND BASE 1ST METATARSAL, WITH PROXIMAL INTERPHALANGEAL RESECTION 4TH TOE RIGHT FOOT;  Surgeon: Nadara Mustard, MD;  Location: [facility] OR;  Service: Orthopedics;  Laterality: Right;  . TOE AMPUTATION  03/15/12, 05/14/12   2nd toe left foot, and 5th toe right foot  . TRANSURETHRAL RESECTION OF PROSTATE    . UPPER GASTROINTESTINAL ENDOSCOPY    . WEIL OSTEOTOMY Left 09/14/2016   Procedure: WEIL OSTEOTOMY LEFT FOOT 4TH METATARSAL;  Surgeon: Nadara Mustard, MD;  Location: [facility] OR;  Service: Orthopedics;  Laterality: Left;   Social History   Occupational History  . Not on file.   Social History Main Topics  . Smoking status: Former Smoker    Packs/day: 1.50    Years: 28.00    Quit date: 07/18/1994  . Smokeless tobacco: Never Used  . Alcohol use 0.6 oz/week    1 Cans of beer per week  . Drug use: No  . Sexual activity: Not on file

## 2016-12-08 ENCOUNTER — Encounter (INDEPENDENT_AMBULATORY_CARE_PROVIDER_SITE_OTHER): Payer: Self-pay | Admitting: Family

## 2016-12-08 ENCOUNTER — Ambulatory Visit (INDEPENDENT_AMBULATORY_CARE_PROVIDER_SITE_OTHER): Payer: Medicare Other | Admitting: Family

## 2016-12-08 VITALS — Ht 76.0 in | Wt 185.0 lb

## 2016-12-08 DIAGNOSIS — Z89512 Acquired absence of left leg below knee: Secondary | ICD-10-CM

## 2016-12-08 NOTE — Progress Notes (Signed)
Post-Op Visit Note   Patient: Tanner CoffinWilliam M Kitchings           Date of Birth: [birth year]           MRN: [id number] Visit Date: 12/08/2016 PCP: Sunnie NielsenMorgan, Gary Lon, MD  Chief Complaint:  Chief Complaint  Patient presents with  . Left Leg - Routine Post Op    11/25/16 left BKA    HPI:  The patient is a 66 year old gentleman who presents today status post left below the knee amputation. Surgery on May 11. Is concerned about some redness around the incision. He is worried that it is infected. Has been wearing the vive compression shrinker with direct skin contact.    Ortho Exam On examination the left residual limb is consolidating well. There is minimal swelling. The incision is approximated with staples. Healing well. There is no gaping no drainage no purulence no odor. There is slight warmth. no cellulitis  Visit Diagnoses:  1. Acquired absence of left leg below knee (HCC)     Plan: Reassurance provided. He will follow-up in one more week for staple removal.  Follow-Up Instructions: Return for as scheduled.   Imaging: No results found.  Orders:  No orders of the defined types were placed in this encounter.  No orders of the defined types were placed in this encounter.    PMFS History: Patient Active Problem List   Diagnosis Date Noted  . Acquired absence of left leg below knee (HCC) 12/05/2016  . Below knee amputation status, left (HCC) 11/25/2016  . Subacute osteomyelitis, left ankle and foot (HCC) 11/17/2016  . Pain in right ankle and joints of right foot 11/03/2016  . Non-pressure chronic ulcer of other part of left foot limited to breakdown of skin (HCC) 09/05/2016  . Ulcer of left foot, limited to breakdown of skin (HCC) 07/22/2016  . Charcot's joint of right foot 06/13/2016  . 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 Acquired claw toe, left    Past Medical History:  Diagnosis Date  . Arthritis   . Asthma   . Constipation   . GERD  (gastroesophageal reflux disease)   . Hemorrhoids   . Hepatitis C    treated in 2014  . History of glaucoma    resolved with cataract surgery  . History of kidney stones    passed  . History of MRSA infection    requiring amputation  . Hypertension   . Neuropathy    Hands Feet  . PTSD (post-traumatic stress disorder)     No family history on file.  Past Surgical History:  Procedure Laterality Date  . AMPUTATION Left 11/25/2016   Procedure: Left Below Knee Amputation;  Surgeon: [name], Marcus V, MD;  [facility];  Service: Orthopedics;  Laterality: Left;  . APPENDECTOMY  12/70  . BACK SURGERY    . CATARACT EXTRACTION  2/12, 4/12  . COLONOSCOPY  04/2015  . EYE SURGERY Bilateral    with lens  . FOOT ARTHRODESIS Left 12/18/2015   Procedure: Left Great Toe Metatarsal , Cunieform Fusion, Lisfranc Fusion;  Surgeon: Nadara MustardMarcus Duda V, MD;  Location: [facility] OR;  Service: Orthopedics;  Laterality: Left;  . LUMBAR LAMINECTOMY/DECOMPRESSION MICRODISCECTOMY  06/26/2012   Procedure: LUMBAR LAMINECTOMY/DECOMPRESSION MICRODISCECTOMY 1 LEVEL;  Surgeon: Barnett AbuHenry Elsner, MD;  Location: [facility];  Service: Neurosurgery;  Laterality: Left;  Left Lumbar four-five Laminectomy/foraminotomy/microscope  . NASAL SINUS SURGERY     4 times  . ORIF ANKLE  FRACTURE Right 05/27/2016   Procedure: OPEN REDUCTION INTERNAL FIXATION (ORIF) LISFRANC AND BASE 1ST METATARSAL, WITH PROXIMAL INTERPHALANGEAL RESECTION 4TH TOE RIGHT FOOT;  Surgeon: Nadara Mustard, MD;  Location: [facility] OR;  Service: Orthopedics;  Laterality: Right;  . TOE AMPUTATION  03/15/12, 05/14/12   2nd toe left foot, and 5th toe right foot  . TRANSURETHRAL RESECTION OF PROSTATE    . UPPER GASTROINTESTINAL ENDOSCOPY    . WEIL OSTEOTOMY Left 09/14/2016   Procedure: WEIL OSTEOTOMY LEFT FOOT 4TH METATARSAL;  Surgeon: Nadara Mustard, MD;  Location: [facility] OR;  Service: Orthopedics;  Laterality: Left;   Social History   Occupational History  . Not on file.   Social  History Main Topics  . Smoking status: Former Smoker    Packs/day: 1.50    Years: 28.00    Quit date: 07/18/1994  . Smokeless tobacco: Never Used  . Alcohol use 0.6 oz/week    1 Cans of beer per week  . Drug use: No  . Sexual activity: Not on file

## 2016-12-13 ENCOUNTER — Encounter (INDEPENDENT_AMBULATORY_CARE_PROVIDER_SITE_OTHER): Payer: Self-pay | Admitting: Family

## 2016-12-13 ENCOUNTER — Ambulatory Visit (INDEPENDENT_AMBULATORY_CARE_PROVIDER_SITE_OTHER): Payer: Medicare Other | Admitting: Family

## 2016-12-13 VITALS — Ht 76.0 in | Wt 185.0 lb

## 2016-12-13 DIAGNOSIS — Z89512 Acquired absence of left leg below knee: Secondary | ICD-10-CM

## 2016-12-13 NOTE — Progress Notes (Signed)
Post-Op Visit Note   Patient: Tanner Santana           Date of Birth: 06/27/1951           MRN: [id number] Visit Date: 12/13/2016 PCP: Sunnie Nielsen, MD  Chief Complaint:  Chief Complaint  Patient presents with  . Left Knee - Follow-up    11/25/16 left BKA vive shrinker. DOI 12/10/16 direct impact on limb    HPI:  The patient is a 66 year old gentleman who presents today 3 weeks status post left below the knee amputation. He's been wearing the medical compression shrinker with direct skin contact. He is concerned today for swelling and pain. He had a fall over the weekend landed directly on his distal residual limb. There is no gaping.    Ortho Exam Incision well proximally and staples is healing well. There is no gaping no erythema no drainage no odor no sign of infection.  Visit Diagnoses:  1. Acquired absence of left leg below knee (HCC)     Plan: Staples harvested without incident. He will continue the shrinker daily with direct skin contact. Follow up in 2 weeks. States already has order for [employer] for a prosthetic.  Follow-Up Instructions: Return in about 2 weeks (around 12/27/2016).   Imaging: No results found.  Orders:  No orders of the defined types were placed in this encounter.  No orders of the defined types were placed in this encounter.    PMFS History: Patient Active Problem List   Diagnosis Date Noted  . Acquired absence of left leg below knee (HCC) 12/05/2016  . Below knee amputation status, left (HCC) 11/25/2016  . Subacute osteomyelitis, left ankle and foot (HCC) 11/17/2016  . Pain in right ankle and joints of right foot 11/03/2016  . Non-pressure chronic ulcer of other part of left foot limited to breakdown of skin (HCC) 09/05/2016  . Ulcer of left foot, limited to breakdown of skin (HCC) 07/22/2016  . Charcot's joint of right foot 06/13/2016  . 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 Acquired claw toe, left     Past Medical History:  Diagnosis Date  . Arthritis   . Asthma   . Constipation   . GERD (gastroesophageal reflux disease)   . Hemorrhoids   . Hepatitis C    treated in 2014  . History of glaucoma    resolved with cataract surgery  . History of kidney stones    passed  . History of MRSA infection    requiring amputation  . Hypertension   . Neuropathy    Hands Feet  . PTSD (post-traumatic stress disorder)     No family history on file.  Past Surgical History:  Procedure Laterality Date  . AMPUTATION Left 11/25/2016   Procedure: Left Below Knee Amputation;  Surgeon: Nadara Mustard, MD;  [facility];  Service: Orthopedics;  Laterality: Left;  . APPENDECTOMY  12/70  . BACK SURGERY    . CATARACT EXTRACTION  2/12, 4/12  . COLONOSCOPY  04/2015  . EYE SURGERY Bilateral    with lens  . FOOT ARTHRODESIS Left 12/18/2015   Procedure: Left Great Toe Metatarsal , Cunieform Fusion, Lisfranc Fusion;  Surgeon: Nadara Mustard, MD;  Location: [facility] OR;  Service: Orthopedics;  Laterality: Left;  . LUMBAR LAMINECTOMY/DECOMPRESSION MICRODISCECTOMY  06/26/2012   Procedure: LUMBAR LAMINECTOMY/DECOMPRESSION MICRODISCECTOMY 1 LEVEL;  Surgeon: Barnett Abu, MD;  Location: [facility];  Service: Neurosurgery;  Laterality: Left;  Left Lumbar four-five Laminectomy/foraminotomy/microscope  . NASAL SINUS SURGERY     4 times  . ORIF ANKLE FRACTURE Right 05/27/2016   Procedure: OPEN REDUCTION INTERNAL FIXATION (ORIF) LISFRANC AND BASE 1ST METATARSAL, WITH PROXIMAL INTERPHALANGEAL RESECTION 4TH TOE RIGHT FOOT;  Surgeon: Nadara MustardMarcus V Duda, MD;  Location: [facility] OR;  Service: Orthopedics;  Laterality: Right;  . TOE AMPUTATION  03/15/12, 05/14/12   2nd toe left foot, and 5th toe right foot  . TRANSURETHRAL RESECTION OF PROSTATE    . UPPER GASTROINTESTINAL ENDOSCOPY    . WEIL OSTEOTOMY Left 09/14/2016   Procedure: WEIL OSTEOTOMY LEFT FOOT 4TH METATARSAL;  Surgeon: Nadara MustardMarcus Duda V, MD;  Location: [facility] OR;  Service:  Orthopedics;  Laterality: Left;   Social History   Occupational History  . Not on file.   Social History Main Topics  . Smoking status: Former Smoker    Packs/day: 1.50    Years: 28.00    Quit date: 07/18/1994  . Smokeless tobacco: Never Used  . Alcohol use 0.6 oz/week    1 Cans of beer per week  . Drug use: No  . Sexual activity: Not on file

## 2016-12-15 ENCOUNTER — Ambulatory Visit (INDEPENDENT_AMBULATORY_CARE_PROVIDER_SITE_OTHER): Payer: Medicare Other | Admitting: Orthopedic Surgery

## 2017-01-02 ENCOUNTER — Ambulatory Visit (INDEPENDENT_AMBULATORY_CARE_PROVIDER_SITE_OTHER): Payer: Medicare Other | Admitting: Orthopedic Surgery

## 2017-01-02 ENCOUNTER — Encounter (INDEPENDENT_AMBULATORY_CARE_PROVIDER_SITE_OTHER): Payer: Self-pay | Admitting: Orthopedic Surgery

## 2017-01-02 VITALS — Ht 76.0 in | Wt 185.0 lb

## 2017-01-02 DIAGNOSIS — Z89512 Acquired absence of left leg below knee: Secondary | ICD-10-CM

## 2017-01-02 NOTE — Progress Notes (Signed)
Office Visit Note   Patient: Tanner Santana           Date of Birth: [birth year]           MRN: [id number] Visit Date: 01/02/2017              Requested by: Sunnie NielsenMorgan, Gary Lon, MD [address] [place], [place] [address] PCP: Sunnie NielsenMorgan, Gary Lon, MD  Chief Complaint  Patient presents with  . Left Leg - Routine Post Op    11/25/16 left BKA working with [employer] for prosthetic       HPI: Patient is a 66 year old gentleman who presents 5 weeks status post left transtibial amputation. Patient was treated with a Prevena wound VAC a stump shrinker and TXA. Patient fell on his 2 weeks postoperatively had dehiscence of the wound laterally and this is healed well with good compression. Patient has no complaints at this time he is ready to be fitted for prosthesis with [employer].  Assessment & Plan: Visit Diagnoses:  1. Acquired absence of left leg below knee [facility](HCC)     Plan: Fitted for prosthesis with [employer] follow-up in the office in 2 months.  Follow-Up Instructions: Return in about 2 months (around 03/04/2017).   Ortho Exam  Patient is alert, oriented, no adenopathy, well-dressed, normal affect, normal respiratory effort. Examination the wound is healed nicely there is good consolidation of the residual limb no open wounds no exposed sutures no cellulitis. No tenderness to palpation.  Imaging: No results found.  Labs: No results found for: HGBA1C, ESRSEDRATE, CRP, LABURIC, REPTSTATUS, GRAMSTAIN, CULT, LABORGA  Orders:  No orders of the defined types were placed in this encounter.  No orders of the defined types were placed in this encounter.    Procedures: No procedures performed  Clinical Data: No additional findings.  ROS:  All other systems negative, except as noted in the HPI. Review of Systems  Objective: Vital Signs: Ht 6\' 4"  (1.93 m)   Wt 185 lb (83.9 kg)   BMI 22.52 kg/m   Specialty Comments:  No specialty comments available.  PMFS  History: Patient Active Problem List   Diagnosis Date Noted  . Acquired absence of left leg below knee (HCC) 12/05/2016  . Below knee amputation status, left (HCC) 11/25/2016  . Subacute osteomyelitis, left ankle and foot (HCC) 11/17/2016  . Pain in right ankle and joints of right foot 11/03/2016  . Non-pressure chronic ulcer of other part of left foot limited to breakdown of skin (HCC) 09/05/2016  . Ulcer of left foot, limited to breakdown of skin (HCC) 07/22/2016  . Charcot's joint of right foot 06/13/2016  . 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 Acquired claw toe, left    Past Medical History:  Diagnosis Date  . Arthritis   . Asthma   . Constipation   . GERD (gastroesophageal reflux disease)   . Hemorrhoids   . Hepatitis C    treated in 2014  . History of glaucoma    resolved with cataract surgery  . History of kidney stones    passed  . History of MRSA infection    requiring amputation  . Hypertension   . Neuropathy    Hands Feet  . PTSD (post-traumatic stress disorder)     No family history on file.  Past Surgical History:  Procedure Laterality Date  . AMPUTATION Left 11/25/2016   Procedure: Left Below Knee Amputation;  Surgeon: [name], Yahye Siebert V, MD;  Location: [facility] OR;  Service: Orthopedics;  Laterality: Left;  . APPENDECTOMY  12/70  . BACK SURGERY    . CATARACT EXTRACTION  2/12, 4/12  . COLONOSCOPY  04/2015  . EYE SURGERY Bilateral    with lens  . FOOT ARTHRODESIS Left 12/18/2015   Procedure: Left Great Toe Metatarsal , Cunieform Fusion, Lisfranc Fusion;  Surgeon: Nadara Mustard, MD;  Location: [facility] OR;  Service: Orthopedics;  Laterality: Left;  . LUMBAR LAMINECTOMY/DECOMPRESSION MICRODISCECTOMY  06/26/2012   Procedure: LUMBAR LAMINECTOMY/DECOMPRESSION MICRODISCECTOMY 1 LEVEL;  Surgeon: Barnett Abu, MD;  Location: [facility];  Service: Neurosurgery;  Laterality: Left;  Left Lumbar four-five Laminectomy/foraminotomy/microscope  . NASAL  SINUS SURGERY     4 times  . ORIF ANKLE FRACTURE Right 05/27/2016   Procedure: OPEN REDUCTION INTERNAL FIXATION (ORIF) LISFRANC AND BASE 1ST METATARSAL, WITH PROXIMAL INTERPHALANGEAL RESECTION 4TH TOE RIGHT FOOT;  Surgeon: Nadara Mustard, MD;  Location: [facility] OR;  Service: Orthopedics;  Laterality: Right;  . TOE AMPUTATION  03/15/12, 05/14/12   2nd toe left foot, and 5th toe right foot  . TRANSURETHRAL RESECTION OF PROSTATE    . UPPER GASTROINTESTINAL ENDOSCOPY    . WEIL OSTEOTOMY Left 09/14/2016   Procedure: WEIL OSTEOTOMY LEFT FOOT 4TH METATARSAL;  Surgeon: Nadara Mustard, MD;  Location: [facility] OR;  Service: Orthopedics;  Laterality: Left;   Social History   Occupational History  . Not on file.   Social History Main Topics  . Smoking status: Former Smoker    Packs/day: 1.50    Years: 28.00    Quit date: 07/18/1994  . Smokeless tobacco: Never Used  . Alcohol use 0.6 oz/week    1 Cans of beer per week  . Drug use: No  . Sexual activity: Not on file

## 2017-02-01 ENCOUNTER — Telehealth (INDEPENDENT_AMBULATORY_CARE_PROVIDER_SITE_OTHER): Payer: Self-pay | Admitting: Orthopedic Surgery

## 2017-02-01 NOTE — Telephone Encounter (Signed)
Records 11/17/2016- present faxed to [employer] to support need for BKA prosthesis [phone]. Ph [phone] ext [phone]/ Britta MccreedyBarbara

## 2017-02-08 ENCOUNTER — Encounter (INDEPENDENT_AMBULATORY_CARE_PROVIDER_SITE_OTHER): Payer: Self-pay | Admitting: Orthopedic Surgery

## 2017-02-08 ENCOUNTER — Ambulatory Visit (INDEPENDENT_AMBULATORY_CARE_PROVIDER_SITE_OTHER): Payer: Medicare Other | Admitting: Orthopedic Surgery

## 2017-02-08 VITALS — Ht 76.0 in | Wt 185.0 lb

## 2017-02-08 DIAGNOSIS — L97511 Non-pressure chronic ulcer of other part of right foot limited to breakdown of skin: Secondary | ICD-10-CM | POA: Diagnosis not present

## 2017-02-08 DIAGNOSIS — Z89512 Acquired absence of left leg below knee: Secondary | ICD-10-CM

## 2017-02-08 NOTE — Progress Notes (Signed)
Office Visit Note   Patient: Tanner CoffinWilliam M Laube           Date of Birth: [birth year]           MRN: [id number] Visit Date: 02/08/2017              Requested by: Sunnie NielsenMorgan, Gary Lon, MD [address] [place], [place] [address] PCP: Sunnie NielsenMorgan, Gary Lon, MD  Chief Complaint  Patient presents with  . Right Foot - Open Wound      HPI: Patient is a 66 year old gentleman diabetic insensate neuropathy left transtibial amputation is currently using his tennis sock on the left who developed a ulcer dorsally over the medial aspect the IP joint right great toe he has been using alcohol and dry dressings.  Assessment & Plan: Visit Diagnoses:  1. Acquired absence of left leg below knee (HCC)   2. Non-pressure chronic ulcer of other part of right foot limited to breakdown of skin (HCC)     Plan: Recommended open toed shoe wear to unload pressure from the great toe recommended antibiotic ointment dressing changes daily with not using alcohol for cleansing and use a donut to unload pressure over the toe. Will follow-up as scheduled.  Follow-Up Instructions: Return if symptoms worsen or fail to improve.   Ortho Exam  Patient is alert, oriented, no adenopathy, well-dressed, normal affect, normal respiratory effort. On examination patient has a small 3 mm x 0.1 mm granulating ulcer over the medial border of the IP joint right great toe. There is no tenderness to palpation the joint has no effusion there is no abscess no cellulitis no clinical signs of infection. The ulcer does not probe to bone joint or tendon. Patient has an antalgic gait. He has a well fitting prosthesis on the left.  Imaging: No results found.  Labs: No results found for: HGBA1C, ESRSEDRATE, CRP, LABURIC, REPTSTATUS, GRAMSTAIN, CULT, LABORGA  Orders:  No orders of the defined types were placed in this encounter.  No orders of the defined types were placed in this encounter.    Procedures: No procedures  performed  Clinical Data: No additional findings.  ROS:  All other systems negative, except as noted in the HPI. Review of Systems  Objective: Vital Signs: Ht 6\' 4"  (1.93 m)   Wt 185 lb (83.9 kg)   BMI 22.52 kg/m   Specialty Comments:  No specialty comments available.  PMFS History: Patient Active Problem List   Diagnosis Date Noted  . Acquired absence of left leg below knee (HCC) 12/05/2016  . Below knee amputation status, left (HCC) 11/25/2016  . Subacute osteomyelitis, left ankle and foot (HCC) 11/17/2016  . Pain in right ankle and joints of right foot 11/03/2016  . Non-pressure chronic ulcer of other part of left foot limited to breakdown of skin (HCC) 09/05/2016  . Ulcer of left foot, limited to breakdown of skin (HCC) 07/22/2016  . Charcot's joint of right foot 06/13/2016  . 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 Acquired claw toe, left    Past Medical History:  Diagnosis Date  . Arthritis   . Asthma   . Constipation   . GERD (gastroesophageal reflux disease)   . Hemorrhoids   . Hepatitis C    treated in 2014  . History of glaucoma    resolved with cataract surgery  . History of kidney stones    passed  . History of MRSA infection    requiring amputation  .  Hypertension   . Neuropathy    Hands Feet  . PTSD (post-traumatic stress disorder)     No family history on file.  Past Surgical History:  Procedure Laterality Date  . AMPUTATION Left 11/25/2016   Procedure: Left Below Knee Amputation;  Surgeon: [name], Minoru Chap V, MD;  [facility];  Service: Orthopedics;  Laterality: Left;  . APPENDECTOMY  12/70  . BACK SURGERY    . CATARACT EXTRACTION  2/12, 4/12  . COLONOSCOPY  04/2015  . EYE SURGERY Bilateral    with lens  . FOOT ARTHRODESIS Left 12/18/2015   Procedure: Left Great Toe Metatarsal , Cunieform Fusion, Lisfranc Fusion;  Surgeon: Nadara MustardMarcus Patina Spanier V, MD;  Location: [facility] OR;  Service: Orthopedics;  Laterality: Left;  .  LUMBAR LAMINECTOMY/DECOMPRESSION MICRODISCECTOMY  06/26/2012   Procedure: LUMBAR LAMINECTOMY/DECOMPRESSION MICRODISCECTOMY 1 LEVEL;  Surgeon: Barnett AbuHenry Elsner, MD;  Location: [facility];  Service: Neurosurgery;  Laterality: Left;  Left Lumbar four-five Laminectomy/foraminotomy/microscope  . NASAL SINUS SURGERY     4 times  . ORIF ANKLE FRACTURE Right 05/27/2016   Procedure: OPEN REDUCTION INTERNAL FIXATION (ORIF) LISFRANC AND BASE 1ST METATARSAL, WITH PROXIMAL INTERPHALANGEAL RESECTION 4TH TOE RIGHT FOOT;  Surgeon: Nadara MustardMarcus V Clebert Wenger, MD;  Location: [facility] OR;  Service: Orthopedics;  Laterality: Right;  . TOE AMPUTATION  03/15/12, 05/14/12   2nd toe left foot, and 5th toe right foot  . TRANSURETHRAL RESECTION OF PROSTATE    . UPPER GASTROINTESTINAL ENDOSCOPY    . WEIL OSTEOTOMY Left 09/14/2016   Procedure: WEIL OSTEOTOMY LEFT FOOT 4TH METATARSAL;  Surgeon: Nadara MustardMarcus Willa Brocks V, MD;  Location: [facility] OR;  Service: Orthopedics;  Laterality: Left;   Social History   Occupational History  . Not on file.   Social History Main Topics  . Smoking status: Former Smoker    Packs/day: 1.50    Years: 28.00    Quit date: 07/18/1994  . Smokeless tobacco: Never Used  . Alcohol use 0.6 oz/week    1 Cans of beer per week  . Drug use: No  . Sexual activity: Not on file

## 2017-02-09 ENCOUNTER — Telehealth (INDEPENDENT_AMBULATORY_CARE_PROVIDER_SITE_OTHER): Payer: Self-pay | Admitting: Orthopedic Surgery

## 2017-02-09 NOTE — Telephone Encounter (Signed)
Detailed written order for [employer] Faxed 02/09/17

## 2017-02-27 ENCOUNTER — Ambulatory Visit (INDEPENDENT_AMBULATORY_CARE_PROVIDER_SITE_OTHER): Payer: Medicare Other | Admitting: Orthopedic Surgery

## 2017-03-03 ENCOUNTER — Ambulatory Visit (INDEPENDENT_AMBULATORY_CARE_PROVIDER_SITE_OTHER): Payer: Medicare Other | Admitting: Orthopedic Surgery

## 2017-03-06 ENCOUNTER — Encounter (INDEPENDENT_AMBULATORY_CARE_PROVIDER_SITE_OTHER): Payer: Self-pay | Admitting: Orthopedic Surgery

## 2017-03-06 ENCOUNTER — Ambulatory Visit (INDEPENDENT_AMBULATORY_CARE_PROVIDER_SITE_OTHER): Payer: Medicare Other | Admitting: Orthopedic Surgery

## 2017-03-06 VITALS — Ht 76.0 in | Wt 185.0 lb

## 2017-03-06 DIAGNOSIS — Z89512 Acquired absence of left leg below knee: Secondary | ICD-10-CM | POA: Diagnosis not present

## 2017-03-06 DIAGNOSIS — L97511 Non-pressure chronic ulcer of other part of right foot limited to breakdown of skin: Secondary | ICD-10-CM | POA: Diagnosis not present

## 2017-03-06 NOTE — Progress Notes (Signed)
Office Visit Note   Patient: Tanner Santana           Date of Birth: 1951/02/10           MRN: [id number] Visit Date: 03/06/2017              Requested by: Sunnie Nielsen, MD [address] [place], [place] [address] PCP: Sunnie Nielsen, MD  Chief Complaint  Patient presents with  . Right Foot - Follow-up  . Left Leg - Follow-up      HPI: Patient is a 66 year old gentleman who presents in follow-up for left transtibial amputation and a callus scab over the medial border of the right great toe IP joint he is been using a donut to unload pressure he uses a postoperative shoe at home he is wearing sneakers today. He states his podiatrist did an ultrasound advised that he had a ganglion cyst and may need surgery.  Assessment & Plan: Visit Diagnoses:  1. Acquired absence of left leg below knee (HCC)   2. Non-pressure chronic ulcer of other part of right foot limited to breakdown of skin (HCC)     Plan: Recommend continuing with the protective shoe wear continue with his stretching sneakers do not feel surgical intervention is necessary for the right great toe at this time discussed that with excision of the medial border of the joint that he may develop wound healing complications. Patient has a well consolidated transtibial amputation the left and will continue bio tech for prosthetic fitting.  Follow-Up Instructions: Return in about 4 weeks (around 04/03/2017).   Ortho Exam  Patient is alert, oriented, no adenopathy, well-dressed, normal affect, normal respiratory effort. Examination he has an antalgic gait. His left transtibial imitation is healed well there is no open ulcers no drainage no cellulitis he is consolidating nicely. He has a well fitting tennis prosthetic socket. Examination the right foot he has a good dorsalis pedis pulse he has a callus scab over the medial border of the IP joint with approximately 5 mm in diameter 0.1 mm deep and there is no fluctuance  no swelling no cellulitis no tenderness to palpation no signs of gout or infection.  Imaging: No results found. No images are attached to the encounter.  Labs: No results found for: HGBA1C, ESRSEDRATE, CRP, LABURIC, REPTSTATUS, GRAMSTAIN, CULT, LABORGA  Orders:  No orders of the defined types were placed in this encounter.  No orders of the defined types were placed in this encounter.    Procedures: No procedures performed  Clinical Data: No additional findings.  ROS:  All other systems negative, except as noted in the HPI. Review of Systems  Objective: Vital Signs: Ht 6\' 4"  (1.93 m)   Wt 185 lb (83.9 kg)   BMI 22.52 kg/m   Specialty Comments:  No specialty comments available.  PMFS History: Patient Active Problem List   Diagnosis Date Noted  . Acquired absence of left leg below knee (HCC) 12/05/2016  . Below knee amputation status, left (HCC) 11/25/2016  . Subacute osteomyelitis, left ankle and foot (HCC) 11/17/2016  . Pain in right ankle and joints of right foot 11/03/2016  . Non-pressure chronic ulcer of other part of left foot limited to breakdown of skin (HCC) 09/05/2016  . Ulcer of left foot, limited to breakdown of skin (HCC) 07/22/2016  . Charcot's joint of right foot 06/13/2016  . 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 Acquired claw toe, left  Past Medical History:  Diagnosis Date  . Arthritis   . Asthma   . Constipation   . GERD (gastroesophageal reflux disease)   . Hemorrhoids   . Hepatitis C    treated in 2014  . History of glaucoma    resolved with cataract surgery  . History of kidney stones    passed  . History of MRSA infection    requiring amputation  . Hypertension   . Neuropathy    Hands Feet  . PTSD (post-traumatic stress disorder)     No family history on file.  Past Surgical History:  Procedure Laterality Date  . AMPUTATION Left 11/25/2016   Procedure: Left Below Knee Amputation;  Surgeon:  Nadara Mustard, MD;  Location[facility] OR;  Service: Orthopedics;  Laterality: Left;  . APPENDECTOMY  12/70  . BACK SURGERY    . CATARACT EXTRACTION  2/12, 4/12  . COLONOSCOPY  04/2015  . EYE SURGERY Bilateral    with lens  . FOOT ARTHRODESIS Left 12/18/2015   Procedure: Left Great Toe Metatarsal , Cunieform Fusion, Lisfranc Fusion;  Surgeon: Nadara Mustard, MD;  Location: [facility] OR;  Service: Orthopedics;  Laterality: Left;  . LUMBAR LAMINECTOMY/DECOMPRESSION MICRODISCECTOMY  06/26/2012   Procedure: LUMBAR LAMINECTOMY/DECOMPRESSION MICRODISCECTOMY 1 LEVEL;  Surgeon: Barnett Abu, MD;  Location: [facility] NEURO ORS;  Service: Neurosurgery;  Laterality: Left;  Left Lumbar four-five Laminectomy/foraminotomy/microscope  . NASAL SINUS SURGERY     4 times  . ORIF ANKLE FRACTURE Right 05/27/2016   Procedure: OPEN REDUCTION INTERNAL FIXATION (ORIF) LISFRANC AND BASE 1ST METATARSAL, WITH PROXIMAL INTERPHALANGEAL RESECTION 4TH TOE RIGHT FOOT;  Surgeon: Nadara Mustard, MD;  Location: [facility] OR;  Service: Orthopedics;  Laterality: Right;  . TOE AMPUTATION  03/15/12, 05/14/12   2nd toe left foot, and 5th toe right foot  . TRANSURETHRAL RESECTION OF PROSTATE    . UPPER GASTROINTESTINAL ENDOSCOPY    . WEIL OSTEOTOMY Left 09/14/2016   Procedure: WEIL OSTEOTOMY LEFT FOOT 4TH METATARSAL;  Surgeon: Nadara Mustard, MD;  Location: [facility] OR;  Service: Orthopedics;  Laterality: Left;   Social History   Occupational History  . Not on file.   Social History Main Topics  . Smoking status: Former Smoker    Packs/day: 1.50    Years: 28.00    Quit date: 07/18/1994  . Smokeless tobacco: Never Used  . Alcohol use 0.6 oz/week    1 Cans of beer per week  . Drug use: No  . Sexual activity: Not on file

## 2017-04-03 ENCOUNTER — Ambulatory Visit (INDEPENDENT_AMBULATORY_CARE_PROVIDER_SITE_OTHER): Payer: Medicare Other | Admitting: Orthopedic Surgery

## 2017-04-03 ENCOUNTER — Encounter (INDEPENDENT_AMBULATORY_CARE_PROVIDER_SITE_OTHER): Payer: Self-pay | Admitting: Orthopedic Surgery

## 2017-04-03 DIAGNOSIS — L97511 Non-pressure chronic ulcer of other part of right foot limited to breakdown of skin: Secondary | ICD-10-CM

## 2017-04-03 DIAGNOSIS — Z89512 Acquired absence of left leg below knee: Secondary | ICD-10-CM

## 2017-04-03 DIAGNOSIS — M25571 Pain in right ankle and joints of right foot: Secondary | ICD-10-CM

## 2017-04-03 NOTE — Progress Notes (Signed)
Office Visit Note   Patient: Tanner Santana           Date of Birth: [birth year]           MRN: [id number] Visit Date: 04/03/2017              Requested by: Sunnie Nielsen, MD [address] [place], [place] [address] PCP: Sunnie Nielsen, MD  Chief Complaint  Patient presents with  . Left Leg - Follow-up    11/25/16 L BKA ~4 months post op  . Right Foot - Follow-up      HPI: Patient is a 66 year old gentleman who presents 4 months status post left transtibial amputation. Patient states that he only uses a cane for uneven terrain. He states it is test socket is fitting well and he will be fitted for a new permanent socket. He states he ulcer on the right great toe has improved and he states he still has pain over the lateral ankle on the right.  Assessment & Plan: Visit Diagnoses:  1. Acquired absence of left leg below knee (HCC)   2. Non-pressure chronic ulcer of other part of right foot limited to breakdown of skin (HCC)   3. Pain in right ankle and joints of right foot     Plan: Recommended an ASO for the right ankle to see if this will help stabilize his ankle deformity. Continue with observation and wound care for the right great toe.  Follow-Up Instructions: Return if symptoms worsen or fail to improve.   Ortho Exam  Patient is alert, oriented, no adenopathy, well-dressed, normal affect, normal respiratory effort. Examination patient ambulates well without his cane. The left transtibial amputation has healed well consolidation no ulcers no callus no signs of infection. Examination the right foot the wounds have healed nicely patient does have a valgus deformity to his heel and has some tenderness and swelling over the lateral ankle ligaments. The peroneal tendons are nontender to palpation. He has valgus forefoot alignment.  Imaging: No results found. No images are attached to the encounter.  Labs: No results found for: HGBA1C, ESRSEDRATE, CRP, LABURIC,  REPTSTATUS, GRAMSTAIN, CULT, LABORGA  Orders:  No orders of the defined types were placed in this encounter.  No orders of the defined types were placed in this encounter.    Procedures: No procedures performed  Clinical Data: No additional findings.  ROS:  All other systems negative, except as noted in the HPI. Review of Systems  Objective: Vital Signs: There were no vitals taken for this visit.  Specialty Comments:  No specialty comments available.  PMFS History: Patient Active Problem List   Diagnosis Date Noted  . Acquired absence of left leg below knee (HCC) 12/05/2016  . Below knee amputation status, left (HCC) 11/25/2016  . Subacute osteomyelitis, left ankle and foot (HCC) 11/17/2016  . Pain in right ankle and joints of right foot 11/03/2016  . Non-pressure chronic ulcer of other part of left foot limited to breakdown of skin (HCC) 09/05/2016  . Ulcer of left foot, limited to breakdown of skin (HCC) 07/22/2016  . Charcot's joint of right foot 06/13/2016  . 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 Acquired claw toe, left    Past Medical History:  Diagnosis Date  . Arthritis   . Asthma   . Constipation   . GERD (gastroesophageal reflux disease)   . Hemorrhoids   . Hepatitis C    treated in  2014  . History of glaucoma    resolved with cataract surgery  . History of kidney stones    passed  . History of MRSA infection    requiring amputation  . Hypertension   . Neuropathy    Hands Feet  . PTSD (post-traumatic stress disorder)     History reviewed. No pertinent family history.  Past Surgical History:  Procedure Laterality Date  . AMPUTATION Left 11/25/2016   Procedure: Left Below Knee Amputation;  Surgeon: Nadara Mustard, MD;  Location: [facility] OR;  Service: Orthopedics;  Laterality: Left;  . APPENDECTOMY  12/70  . BACK SURGERY    . CATARACT EXTRACTION  2/12, 4/12  . COLONOSCOPY  04/2015  . EYE SURGERY Bilateral    with  lens  . FOOT ARTHRODESIS Left 12/18/2015   Procedure: Left Great Toe Metatarsal , Cunieform Fusion, Lisfranc Fusion;  Surgeon: Nadara Mustard, MD;  Location: [facility] OR;  Service: Orthopedics;  Laterality: Left;  . LUMBAR LAMINECTOMY/DECOMPRESSION MICRODISCECTOMY  06/26/2012   Procedure: LUMBAR LAMINECTOMY/DECOMPRESSION MICRODISCECTOMY 1 LEVEL;  Surgeon: Barnett Abu, MD;  Location: [facility] OR[facility];  Service: Neurosurgery;  Laterality: Left;  Left Lumbar four-five Laminectomy/foraminotomy/microscope  . NASAL SINUS SURGERY     4 times  . ORIF ANKLE FRACTURE Right 05/27/2016   Procedure: OPEN REDUCTION INTERNAL FIXATION (ORIF) LISFRANC AND BASE 1ST METATARSAL, WITH PROXIMAL INTERPHALANGEAL RESECTION 4TH TOE RIGHT FOOT;  Surgeon: Nadara Mustard, MD;  Location: [facility] OR;  Service: Orthopedics;  Laterality: Right;  . TOE AMPUTATION  03/15/12, 05/14/12   2nd toe left foot, and 5th toe right foot  . TRANSURETHRAL RESECTION OF PROSTATE    . UPPER GASTROINTESTINAL ENDOSCOPY    . WEIL OSTEOTOMY Left 09/14/2016   Procedure: WEIL OSTEOTOMY LEFT FOOT 4TH METATARSAL;  Surgeon: Nadara Mustard, MD;  Location: [facility] OR;  Service: Orthopedics;  Laterality: Left;   Social History   Occupational History  . Not on file.   Social History Main Topics  . Smoking status: Former Smoker    Packs/day: 1.50    Years: 28.00    Quit date: 07/18/1994  . Smokeless tobacco: Never Used  . Alcohol use 0.6 oz/week    1 Cans of beer per week  . Drug use: No  . Sexual activity: Not on file

## 2017-12-21 ENCOUNTER — Ambulatory Visit (INDEPENDENT_AMBULATORY_CARE_PROVIDER_SITE_OTHER): Payer: Medicare Other | Admitting: Orthopedic Surgery

## 2017-12-21 ENCOUNTER — Encounter (INDEPENDENT_AMBULATORY_CARE_PROVIDER_SITE_OTHER): Payer: Self-pay | Admitting: Orthopedic Surgery

## 2017-12-21 DIAGNOSIS — Z89512 Acquired absence of left leg below knee: Secondary | ICD-10-CM

## 2017-12-21 NOTE — Progress Notes (Signed)
Office Visit Note   Patient: Tanner Santana           Date of Birth: Dec 04, 1950           MRN: [id number] Visit Date: 12/21/2017              Requested by: Sunnie Nielsen, MD [address] [place], [place] [address] PCP: Sunnie Nielsen, MD  Chief Complaint  Patient presents with  . Left Leg - Follow-up      HPI: Patient is a 67 year old gentleman status post left transtibial amputation.  Patient has been much more active than with his initial prosthetic fitting.  He is a K3 level ambulator he is walking up and down hills he is kayaking he is active outdoor hiking he is walking up and down bleachers and cannot perform his activities daily living with his current prosthesis.  He has worn out the liner and has a foot and ankle that is insufficient to meet his needs.  Assessment & Plan: Visit Diagnoses:  1. Acquired absence of left leg below knee (HCC)     Plan: A new prescription was provided for biotech for new liner new foot and ankle.  Patient socket is intact and he is wearing 7 ply socks.  Follow-Up Instructions: Return if symptoms worsen or fail to improve.   Ortho Exam  Patient is alert, oriented, no adenopathy, well-dressed, normal affect, normal respiratory effort. Examination patient has a normal gait.  He has a well fitting prosthesis the foot and ankle are broken and provide insufficient support for his activities.  Imaging: No results found. No images are attached to the encounter.  Labs: No results found for: HGBA1C, ESRSEDRATE, CRP, LABURIC, REPTSTATUS, GRAMSTAIN, CULT, LABORGA   Lab Results  Component Value Date   ALBUMIN 3.8 06/20/2012    There is no height or weight on file to calculate BMI.  Orders:  No orders of the defined types were placed in this encounter.  No orders of the defined types were placed in this encounter.    Procedures: No procedures performed  Clinical Data: No additional findings.  ROS:  All other  systems negative, except as noted in the HPI. Review of Systems  Objective: Vital Signs: There were no vitals taken for this visit.  Specialty Comments:  No specialty comments available.  PMFS History: Patient Active Problem List   Diagnosis Date Noted  . Acquired absence of left leg below knee (HCC) 12/05/2016  . Below knee amputation status, left (HCC) 11/25/2016  . Subacute osteomyelitis, left ankle and foot (HCC) 11/17/2016  . Pain in right ankle and joints of right foot 11/03/2016  . Non-pressure chronic ulcer of other part of left foot limited to breakdown of skin (HCC) 09/05/2016  . Ulcer of left foot, limited to breakdown of skin (HCC) 07/22/2016  . Charcot's joint of right foot 06/13/2016  . 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 Acquired claw toe, left    Past Medical History:  Diagnosis Date  . Arthritis   . Asthma   . Constipation   . GERD (gastroesophageal reflux disease)   . Hemorrhoids   . Hepatitis C    treated in 2014  . History of glaucoma    resolved with cataract surgery  . History of kidney stones    passed  . History of MRSA infection    requiring amputation  . Hypertension   . Neuropathy    Hands  Feet  . PTSD (post-traumatic stress disorder)     History reviewed. No pertinent family history.  Past Surgical History:  Procedure Laterality Date  . AMPUTATION Left 11/25/2016   Procedure: Left Below Knee Amputation;  Surgeon: [name], Marcus V, MD;  [facility];  Service: Orthopedics;  Laterality: Left;  . APPENDECTOMY  12/70  . BACK SURGERY    . CATARACT EXTRACTION  2/12, 4/12  . COLONOSCOPY  04/2015  . EYE SURGERY Bilateral    with lens  . FOOT ARTHRODESIS Left 12/18/2015   Procedure: Left Great Toe Metatarsal , Cunieform Fusion, Lisfranc Fusion;  Surgeon: Nadara MustardMarcus Duda V, MD;  Location: [facility] OR;  Service: Orthopedics;  Laterality: Left;  . LUMBAR LAMINECTOMY/DECOMPRESSION MICRODISCECTOMY  06/26/2012   Procedure:  LUMBAR LAMINECTOMY/DECOMPRESSION MICRODISCECTOMY 1 LEVEL;  Surgeon: Barnett AbuHenry Elsner, MD;  Location: [facility];  Service: Neurosurgery;  Laterality: Left;  Left Lumbar four-five Laminectomy/foraminotomy/microscope  . NASAL SINUS SURGERY     4 times  . ORIF ANKLE FRACTURE Right 05/27/2016   Procedure: OPEN REDUCTION INTERNAL FIXATION (ORIF) LISFRANC AND BASE 1ST METATARSAL, WITH PROXIMAL INTERPHALANGEAL RESECTION 4TH TOE RIGHT FOOT;  Surgeon: Nadara MustardMarcus V Duda, MD;  Location: [facility] OR;  Service: Orthopedics;  Laterality: Right;  . TOE AMPUTATION  03/15/12, 05/14/12   2nd toe left foot, and 5th toe right foot  . TRANSURETHRAL RESECTION OF PROSTATE    . UPPER GASTROINTESTINAL ENDOSCOPY    . WEIL OSTEOTOMY Left 09/14/2016   Procedure: WEIL OSTEOTOMY LEFT FOOT 4TH METATARSAL;  Surgeon: Nadara MustardMarcus Duda V, MD;  Location: [facility] OR;  Service: Orthopedics;  Laterality: Left;   Social History   Occupational History  . Not on file  Tobacco Use  . Smoking status: Former Smoker    Packs/day: 1.50    Years: 28.00    Pack years: 42.00    Last attempt to quit: 07/18/1994    Years since quitting: 23.4  . Smokeless tobacco: Never Used  Substance and Sexual Activity  . Alcohol use: Yes    Alcohol/week: 0.6 oz    Types: 1 Cans of beer per week  . Drug use: No  . Sexual activity: Not on file
